# Patient Record
Sex: Female | Born: 1955 | Race: White | Hispanic: No | Marital: Married | Smoking: Former smoker
Health system: Southern US, Community
[De-identification: ages and names within clinical notes are randomized; demographics above are authoritative.]

## PROBLEM LIST (undated history)

## (undated) DIAGNOSIS — I10 Essential (primary) hypertension: Secondary | ICD-10-CM

## (undated) DIAGNOSIS — T7840XA Allergy, unspecified, initial encounter: Secondary | ICD-10-CM

## (undated) DIAGNOSIS — E785 Hyperlipidemia, unspecified: Secondary | ICD-10-CM

## (undated) DIAGNOSIS — Z9889 Other specified postprocedural states: Secondary | ICD-10-CM

## (undated) DIAGNOSIS — I2510 Atherosclerotic heart disease of native coronary artery without angina pectoris: Secondary | ICD-10-CM

## (undated) HISTORY — DX: Allergy, unspecified, initial encounter: T78.40XA

## (undated) HISTORY — PX: ENDOMETRIAL ABLATION: SHX621

## (undated) HISTORY — DX: Atherosclerotic heart disease of native coronary artery without angina pectoris: I25.10

## (undated) HISTORY — PX: TUBAL LIGATION: SHX77

## (undated) HISTORY — DX: Essential (primary) hypertension: I10

## (undated) HISTORY — DX: Hyperlipidemia, unspecified: E78.5

---

## 1997-11-28 ENCOUNTER — Other Ambulatory Visit: Admission: RE | Admit: 1997-11-28 | Discharge: 1997-11-28 | Payer: Self-pay | Admitting: Obstetrics and Gynecology

## 1998-04-18 ENCOUNTER — Other Ambulatory Visit: Admission: RE | Admit: 1998-04-18 | Discharge: 1998-04-18 | Payer: Self-pay | Admitting: Obstetrics and Gynecology

## 2000-09-29 ENCOUNTER — Encounter (INDEPENDENT_AMBULATORY_CARE_PROVIDER_SITE_OTHER): Payer: Self-pay

## 2000-09-29 ENCOUNTER — Ambulatory Visit (HOSPITAL_COMMUNITY): Admission: RE | Admit: 2000-09-29 | Discharge: 2000-09-29 | Payer: Self-pay | Admitting: Obstetrics and Gynecology

## 2001-11-22 ENCOUNTER — Emergency Department (HOSPITAL_COMMUNITY): Admission: EM | Admit: 2001-11-22 | Discharge: 2001-11-22 | Payer: Self-pay | Admitting: Emergency Medicine

## 2001-11-24 ENCOUNTER — Other Ambulatory Visit: Admission: RE | Admit: 2001-11-24 | Discharge: 2001-11-24 | Payer: Self-pay | Admitting: Obstetrics and Gynecology

## 2002-11-15 ENCOUNTER — Other Ambulatory Visit: Admission: RE | Admit: 2002-11-15 | Discharge: 2002-11-15 | Payer: Self-pay | Admitting: Specialist

## 2002-11-17 ENCOUNTER — Encounter: Payer: Self-pay | Admitting: Specialist

## 2002-11-17 ENCOUNTER — Ambulatory Visit (HOSPITAL_COMMUNITY): Admission: RE | Admit: 2002-11-17 | Discharge: 2002-11-17 | Payer: Self-pay | Admitting: Specialist

## 2003-07-20 ENCOUNTER — Ambulatory Visit (HOSPITAL_COMMUNITY): Admission: RE | Admit: 2003-07-20 | Discharge: 2003-07-20 | Payer: Self-pay | Admitting: Obstetrics & Gynecology

## 2004-05-07 ENCOUNTER — Ambulatory Visit (HOSPITAL_COMMUNITY): Admission: RE | Admit: 2004-05-07 | Discharge: 2004-05-07 | Payer: Self-pay | Admitting: Internal Medicine

## 2005-03-31 ENCOUNTER — Ambulatory Visit (HOSPITAL_COMMUNITY): Admission: RE | Admit: 2005-03-31 | Discharge: 2005-03-31 | Payer: Self-pay

## 2005-08-19 ENCOUNTER — Other Ambulatory Visit: Admission: RE | Admit: 2005-08-19 | Discharge: 2005-08-19 | Payer: Self-pay | Admitting: Obstetrics and Gynecology

## 2005-09-03 ENCOUNTER — Encounter: Payer: Self-pay | Admitting: Obstetrics & Gynecology

## 2005-09-03 ENCOUNTER — Ambulatory Visit (HOSPITAL_COMMUNITY): Admission: RE | Admit: 2005-09-03 | Discharge: 2005-09-03 | Payer: Self-pay | Admitting: Obstetrics & Gynecology

## 2005-12-22 ENCOUNTER — Ambulatory Visit: Payer: Self-pay | Admitting: Internal Medicine

## 2006-04-30 ENCOUNTER — Ambulatory Visit (HOSPITAL_COMMUNITY): Admission: RE | Admit: 2006-04-30 | Discharge: 2006-04-30 | Payer: Self-pay | Admitting: Obstetrics & Gynecology

## 2007-05-18 ENCOUNTER — Ambulatory Visit (HOSPITAL_COMMUNITY): Admission: RE | Admit: 2007-05-18 | Discharge: 2007-05-18 | Payer: Self-pay | Admitting: Obstetrics & Gynecology

## 2007-05-31 ENCOUNTER — Other Ambulatory Visit: Admission: RE | Admit: 2007-05-31 | Discharge: 2007-05-31 | Payer: Self-pay | Admitting: Obstetrics & Gynecology

## 2008-07-11 ENCOUNTER — Ambulatory Visit (HOSPITAL_COMMUNITY): Admission: RE | Admit: 2008-07-11 | Discharge: 2008-07-11 | Payer: Self-pay | Admitting: Obstetrics & Gynecology

## 2008-07-24 ENCOUNTER — Other Ambulatory Visit: Admission: RE | Admit: 2008-07-24 | Discharge: 2008-07-24 | Payer: Self-pay | Admitting: Obstetrics & Gynecology

## 2009-08-10 ENCOUNTER — Ambulatory Visit (HOSPITAL_COMMUNITY): Admission: RE | Admit: 2009-08-10 | Discharge: 2009-08-10 | Payer: Self-pay | Admitting: Obstetrics & Gynecology

## 2009-09-24 ENCOUNTER — Other Ambulatory Visit: Admission: RE | Admit: 2009-09-24 | Discharge: 2009-09-24 | Payer: Self-pay | Admitting: Obstetrics & Gynecology

## 2010-12-20 NOTE — Op Note (Signed)
Donna Mckay, Donna Mckay                  ACCOUNT NO.:  [id number]   MEDICAL RECORD NO.:  [id number]          PATIENT TYPE:  AMB   LOCATION:  DAY                           FACILITY:  [facility]   PHYSICIAN:  Lazaro Arms, M.D.   DATE OF BIRTH:  05-09-1956   DATE OF PROCEDURE:  09/03/2005  DATE OF DISCHARGE:                                 OPERATIVE REPORT   PREOPERATIVE DIAGNOSES:  1.  Menometrorrhagia.  2.  Anemia.   POSTOPERATIVE DIAGNOSES:  1.  Menometrorrhagia.  2.  Anemia.   PROCEDURE:  Hysteroscopy D&C, endometrial ablation.   SURGEON:  Dr. Despina Hidden.   ANESTHESIA:  General endotracheal.   FINDINGS:  The patient had normal endometrial cavity. There were no polyps  or submucosal myomas seen. She had an endometrial biopsy in the office which  had been normal.   DESCRIPTION OF OPERATION:  The patient was taken to the operating room and  placed in supine position where she underwent general endotracheal  anesthesia. She was placed in dorsal lithotomy position, prepped and draped  for vaginal surgery. A Grave's speculum was placed. Cervix was grasped.  Paracervical block using 0.5% Marcaine with 1:200,000 epinephrine was  placed, 20 cc total. The cervix was dilated serially to allow passage of the  hysteroscope. Endometrial cavity was found to be normal. Vigorous uterine  curettage was performed. The patient had been premedicated with Megace. A  endometrial ablation was then performed using ThermaChoice III balloon  system. It required 80 cc of D5W, required 3+ heating cycles for a total  therapy time of 21 minutes 25 seconds. It was 8-minute time of 87 to 88  degrees Celsius.  All of the fluid was recovered at the end of the  procedure, and the pressure remained stable between 155 and 165 mmHg  throughout the case. Again, the fluid was removed from the balloon. The  balloon was removed. The patient was awakened from anesthesia, taken to  recovery room in good and stable condition. She  received Ancef and Toradol  prophylactically.      Lazaro Arms, M.D.  Electronically Signed    LHE/MEDQ  D:  09/03/2005  T:  09/03/2005  Job:  [id number]

## 2010-12-20 NOTE — Op Note (Signed)
[facility]  Patient:    Donna Mckay, Donna Mckay                         MRN: [id number] Proc. Date: 09/29/00 Adm. Date:  [id number] Attending:  Amanda Cockayne                           Operative Report  PREOPERATIVE DIAGNOSIS:       Irregular bleeding and spotting on no hormones at 55 years old.  POSTOPERATIVE DIAGNOSIS:      Multiple polyps, thickened endometrium, perimenopausal.  OPERATION:  SURGEON:                      Esmeralda Arthur, M.D.  ASSISTANT:  ANESTHESIA:                   General anesthesia.  PACKS:                        None.  CATHETERS:                    None.  MEDIUM:                       Sorbitol, deficit 80 cc.  FINDINGS:                     The uterus sounded to 12 cm.  She had multiple polyps and thickened endometrium.  ESTIMATED BLOOD LOSS:  INDICATIONS:                  She had an ultrasound which showed thickened endometrium, probably polyps.  She is admitted for hysteroscopy, resection.  DESCRIPTION OF PROCEDURE:     The patient was carried to the operating room and after satisfactory general anesthesia, she was placed in the lithotomy position and prepped and draped in a sterile field.  Examination revealed the uterus to be anterior and felt enlarged.  We sounded the cervix to 12 cm.  She had no resistance to 25 Hegar dilator. We looked with the observation scope and the uterus was too big to see and you could see multiple polyps.  We then put on the resectoscope and she had very little resistance almost to 33 Hegar.  We then put in and we could see multiple polyps and I was going to resect these, but could not get a grasp.  I therefore, stopped and did the curettement and removed lots of polyps.  I then relooked and it looked like she still had a thickened lining.  With the resectoscope double blade, we then resected the endometrium and a lot of tissue was obtained.  We then coagulated and  progressed with decreased suppression.  She had no excess bleeding.  The patient tolerated the procedure well and was carried to the recovery room in good condition. DD:  09/29/00 TD:  09/30/00 Job: [id number]

## 2010-12-20 NOTE — Op Note (Signed)
NAME:  Donna Mckay, Donna Mckay                  ACCOUNT NO.:  [id number]   MEDICAL RECORD NO.:  [id number]          PATIENT TYPE:  AMB   LOCATION:  DAY                           FACILITY:  [facility]   PHYSICIAN:  R. Roetta Sessions, M.D. DATE OF BIRTH:  1956-02-04   DATE OF PROCEDURE:  05/07/2004  DATE OF DISCHARGE:                                 OPERATIVE REPORT   PROCEDURE:  Colonoscopy with ileoscopy, biopsy, and stool sampling.   INDICATION FOR PROCEDURE:  The patient is a 55 year old lady with abdominal  cramps, loose stools, poor response to antispasmodic therapy.  Colonoscopy  is now being done to further evaluate her as to the etiology of her chronic  diarrhea.  This approach has been discussed with the patient at length.  The  potential risks, benefits, and alternatives have been reviewed, questions  answered.  She is agreeable.  Please see my April 23, 2004, consultation  note for more information note.   PROCEDURE NOTE:  O2 saturation, blood pressure, pulse, and respirations were  monitored throughout the entire procedure.   CONSCIOUS SEDATION:  IV Versed 3 mg and Demerol 50 mg IV in incremental  doses.   INSTRUMENT USED:  Olympus video chip system.   FINDINGS:  Digital exam revealed no abnormalities.   ENDOSCOPIC FINDINGS:  Prep was good.   Rectum:  Examination of the rectal mucosa, including retroflexion view of  the anal verge, revealed no abnormalities.   Colon:  The colonic mucosa was surveyed from the rectosigmoid junction  through the left, transverse, and right colon to the area of the appendiceal  orifice, ileocecal valve, and cecum.  These structures were well-seen and  photographed for the record.  The terminal ileum was intubated to 10 cm.  From this level the scope was slowly withdrawn and all previously-mentioned  mucosal surfaces were again seen.  The terminal ileum appeared normal.  The  colonic mucosa appeared entirely normal except for some early diverticular  changes, left colon.  Biopsies of the sigmoid and rectum were taken for  histologic study.  Also, stool residue was suctioned out for microbiology  studies.  The patient tolerated the procedure well and was reacted in  endoscopy.   IMPRESSION:  1.  Normal rectum.  2.  Early diverticular changes, left colon, otherwise normal colon, normal      terminal ileum.  3.  Segmental biopsies and stool sample taken.   RECOMMENDATIONS:  1.  Continue Bentyl for the time being.  2.  Follow up on stool studies, biopsies.  3.  Further recommendations to follow.     [name]   RMR/MEDQ  D:  05/07/2004  T:  05/07/2004  Job:  [id number]   cc:   Kingsley Callander. Ouida Sills, M.D.  [address]  [place]  [place] [address]  Fax: [phone]

## 2010-12-20 NOTE — H&P (Signed)
NAME:  Donna Mckay                  ACCOUNT NO.:  [id number]   MEDICAL RECORD NO.:  [id number]          PATIENT TYPE:  AMB   LOCATION:  DAY                           FACILITY:  [facility]   PHYSICIAN:  Lazaro Arms, M.D.   DATE OF BIRTH:  [birth year]   DATE OF ADMISSION:  DATE OF DISCHARGE:  LH                                HISTORY & PHYSICAL   HISTORY OF PRESENT ILLNESS:  Donna Mckay is a 55 year old female who is gravida 4,  para 3.  Last menstrual period was this month with a hemoglobin of 9.5.  She  has been managed for some time for menorrhagia and anemia with birth control  pills, iron.  She is having increasing perimenopausal symptoms, and was  placed on Estrogel.  She presented earlier in the month with increasing  bleeding with a big clot.  She had a vaginal ultrasound in our office which  revealed a 14 mm endometrial stripe.  Endometrial biopsy was performed that  showed disturbed phase endometrium.  Because of the patient's long problem  with dysfunctional bleeding and menometrorrhagia, she is admitted for  hysteroscopy D&C, endometrial ablation.   PAST MEDICAL HISTORY:  1.  Hypertension, and she takes atenolol.  2.  Gastroesophageal reflux disease, and she takes AcipHex.  3.  She is on Repleva for anemia.   PAST SURGICAL HISTORY:  1.  She had tubal ligation in 1982.  2.  D&C in 1977 and D&C in 2000 because of bleeding.   PAST OBSTETRICAL HISTORY:  Three vaginal deliveries.   REVIEW OF SYSTEMS:  Otherwise negative.   PHYSICAL EXAMINATION:  VITAL SIGNS:  Weight is 198 pounds.  HEENT:  Unremarkable.  Thyroid is normal.  LUNGS:  Clear.  HEART:  Regular rate and rhythm without regurgitation or gallop.  BREASTS:  Without masses or discharge or skin changes.  ABDOMEN:  Benign without hepatosplenomegaly or masses.  PELVIC:  She has normal external genitalia.  The vagina is pink and moist  without discharge.  Cervix is parous.  Uterus is slightly enlarged,  approximately 8 weeks  size.  Adnexa negative.  EXTREMITIES:  Warm, no edema.  NEUROLOGIC:  Grossly intact.   IMPRESSION:  1.  Menometrorrhagia.  2.  Anemia.   PLAN:  The patient is admitted for hysteroscopy D&C, endometrial ablation.  She understands the risks, benefits, indications, and alternatives  and  wants to proceed.      Lazaro Arms, M.D.  Electronically Signed     LHE/MEDQ  D:  09/02/2005  T:  09/02/2005  Job:  [id number]

## 2011-09-02 ENCOUNTER — Other Ambulatory Visit: Payer: Self-pay | Admitting: Obstetrics & Gynecology

## 2011-09-02 DIAGNOSIS — Z139 Encounter for screening, unspecified: Secondary | ICD-10-CM

## 2011-09-08 ENCOUNTER — Ambulatory Visit (HOSPITAL_COMMUNITY)
Admission: RE | Admit: 2011-09-08 | Discharge: 2011-09-08 | Disposition: A | Discharge status: Home / Self Care | Payer: Self-pay | Source: Ambulatory Visit | Attending: Obstetrics & Gynecology | Admitting: Obstetrics & Gynecology

## 2011-09-08 DIAGNOSIS — Z1231 Encounter for screening mammogram for malignant neoplasm of breast: Secondary | ICD-10-CM | POA: Insufficient documentation

## 2011-09-08 DIAGNOSIS — Z139 Encounter for screening, unspecified: Secondary | ICD-10-CM

## 2011-09-23 ENCOUNTER — Other Ambulatory Visit (HOSPITAL_COMMUNITY)
Admission: RE | Admit: 2011-09-23 | Discharge: 2011-09-23 | Disposition: A | Discharge status: Home / Self Care | Payer: Self-pay | Source: Ambulatory Visit | Attending: Obstetrics & Gynecology | Admitting: Obstetrics & Gynecology

## 2011-09-23 ENCOUNTER — Other Ambulatory Visit: Payer: Self-pay | Admitting: Obstetrics & Gynecology

## 2011-09-23 DIAGNOSIS — Z01419 Encounter for gynecological examination (general) (routine) without abnormal findings: Secondary | ICD-10-CM | POA: Insufficient documentation

## 2016-01-09 ENCOUNTER — Other Ambulatory Visit: Payer: Self-pay | Admitting: Obstetrics & Gynecology

## 2016-01-09 DIAGNOSIS — Z1231 Encounter for screening mammogram for malignant neoplasm of breast: Secondary | ICD-10-CM

## 2016-01-10 ENCOUNTER — Ambulatory Visit (HOSPITAL_COMMUNITY)
Admission: RE | Admit: 2016-01-10 | Discharge: 2016-01-10 | Disposition: A | Discharge status: Home / Self Care | Payer: Self-pay | Source: Ambulatory Visit | Attending: Obstetrics & Gynecology | Admitting: Obstetrics & Gynecology

## 2016-01-10 DIAGNOSIS — Z1231 Encounter for screening mammogram for malignant neoplasm of breast: Secondary | ICD-10-CM | POA: Insufficient documentation

## 2016-01-25 ENCOUNTER — Encounter: Payer: Self-pay | Admitting: Obstetrics & Gynecology

## 2016-01-25 ENCOUNTER — Ambulatory Visit (INDEPENDENT_AMBULATORY_CARE_PROVIDER_SITE_OTHER): Payer: Self-pay | Admitting: Obstetrics & Gynecology

## 2016-01-25 ENCOUNTER — Other Ambulatory Visit (HOSPITAL_COMMUNITY)
Admission: RE | Admit: 2016-01-25 | Discharge: 2016-01-25 | Disposition: A | Discharge status: Home / Self Care | Payer: Self-pay | Source: Ambulatory Visit | Attending: Obstetrics & Gynecology | Admitting: Obstetrics & Gynecology

## 2016-01-25 VITALS — BP 162/92 | HR 76 | Ht 64.0 in | Wt 192.5 lb

## 2016-01-25 DIAGNOSIS — Z1212 Encounter for screening for malignant neoplasm of rectum: Secondary | ICD-10-CM

## 2016-01-25 DIAGNOSIS — Z01419 Encounter for gynecological examination (general) (routine) without abnormal findings: Secondary | ICD-10-CM | POA: Insufficient documentation

## 2016-01-25 DIAGNOSIS — Z1151 Encounter for screening for human papillomavirus (HPV): Secondary | ICD-10-CM | POA: Insufficient documentation

## 2016-01-25 DIAGNOSIS — Z1211 Encounter for screening for malignant neoplasm of colon: Secondary | ICD-10-CM

## 2016-01-25 NOTE — Progress Notes (Signed)
Patient ID: Donna Mckay, female   DOB: [birth year], 60 y.o.   MRN: [id number] Subjective:     Donna Mckay is a 60 y.o. female here for a routine exam.  No LMP recorded. Patient has had an ablation. No obstetric history on file. Birth Control Method:  BTL Menstrual Calendar(currently): amenorrhiec  Current complaints: none.   Current acute medical issues:  none   Recent Gynecologic History No LMP recorded. Patient has had an ablation. Last Pap: 2013,  normal Last mammogram: 2013,  normal  Past Medical History:  Diagnosis Date  . Allergy   . Hypertension     Past Surgical History:  Procedure Laterality Date  . ENDOMETRIAL ABLATION    . TUBAL LIGATION      OB History    No data available      Social History   Social History  . Marital status: Married    Spouse name: N/A  . Number of children: N/A  . Years of education: N/A   Social History Main Topics  . Smoking status: Former Smoker    Types: Cigarettes  . Smokeless tobacco: Never Used  . Alcohol use No  . Drug use: No  . Sexual activity: Yes    Birth control/ protection: Surgical   Other Topics Concern  . None   Social History Narrative  . None    Family History  Problem Relation Age of Onset  . Heart disease Mother   . Hypertension Mother   . Hypertension Father   . Diabetes Father   . Heart disease Father   . Cancer Father     prostate  . Cancer Brother   . Hypertension Daughter   . Cancer Maternal Aunt     breast     Current Outpatient Prescriptions:  .  cetirizine (ZYRTEC) 10 MG tablet, Take 10 mg by mouth daily., Disp: , Rfl:  .  Probiotic Product (PROBIOTIC DAILY) CAPS, Take 1 tablet by mouth daily., Disp: , Rfl:  .  lisinopril (PRINIVIL,ZESTRIL) 10 MG tablet, Take 10 mg by mouth daily., Disp: , Rfl: 0  Review of Systems  Review of Systems  Constitutional: Negative for fever, chills, weight loss, malaise/fatigue and diaphoresis.  HENT: Negative for hearing loss, ear pain,  nosebleeds, congestion, sore throat, neck pain, tinnitus and ear discharge.   Eyes: Negative for blurred vision, double vision, photophobia, pain, discharge and redness.  Respiratory: Negative for cough, hemoptysis, sputum production, shortness of breath, wheezing and stridor.   Cardiovascular: Negative for chest pain, palpitations, orthopnea, claudication, leg swelling and PND.  Gastrointestinal: negative for abdominal pain. Negative for heartburn, nausea, vomiting, diarrhea, constipation, blood in stool and melena.  Genitourinary: Negative for dysuria, urgency, frequency, hematuria and flank pain.  Musculoskeletal: Negative for myalgias, back pain, joint pain and falls.  Skin: Negative for itching and rash.  Neurological: Negative for dizziness, tingling, tremors, sensory change, speech change, focal weakness, seizures, loss of consciousness, weakness and headaches.  Endo/Heme/Allergies: Negative for environmental allergies and polydipsia. Does not bruise/bleed easily.  Psychiatric/Behavioral: Negative for depression, suicidal ideas, hallucinations, memory loss and substance abuse. The patient is not nervous/anxious and does not have insomnia.        Objective:  Blood pressure (!) 162/92, pulse 76, height 5\' 4"  (1.626 m), weight 192 lb 8 oz (87.3 kg).   Physical Exam  Vitals reviewed. Constitutional: She is oriented to person, place, and time. She appears well-developed and well-nourished.  HENT:  Head: Normocephalic and atraumatic.  Right Ear: External ear normal.  Left Ear: External ear normal.  Nose: Nose normal.  Mouth/Throat: Oropharynx is clear and moist.  Eyes: Conjunctivae and EOM are normal. Pupils are equal, round, and reactive to light. Right eye exhibits no discharge. Left eye exhibits no discharge. No scleral icterus.  Neck: Normal range of motion. Neck supple. No tracheal deviation present. No thyromegaly present.  Cardiovascular: Normal rate, regular rhythm, normal  heart sounds and intact distal pulses.  Exam reveals no gallop and no friction rub.   No murmur heard. Respiratory: Effort normal and breath sounds normal. No respiratory distress. She has no wheezes. She has no rales. She exhibits no tenderness.  GI: Soft. Bowel sounds are normal. She exhibits no distension and no mass. There is no tenderness. There is no rebound and no guarding.  Genitourinary:  Breasts no masses skin changes or nipple changes bilaterally      Vulva is normal without lesions Vagina is pink moist without discharge Cervix normal in appearance and pap is done Uterus is normal size shape and contour Adnexa is negative with normal sized ovaries  {Rectal    hemoccult negative, normal tone, no masses  Musculoskeletal: Normal range of motion. She exhibits no edema and no tenderness.  Neurological: She is alert and oriented to person, place, and time. She has normal reflexes. She displays normal reflexes. No cranial nerve deficit. She exhibits normal muscle tone. Coordination normal.  Skin: Skin is warm and dry. No rash noted. No erythema. No pallor.  Psychiatric: She has a normal mood and affect. Her behavior is normal. Judgment and thought content normal.       Medications Ordered at today's visit: Meds ordered this encounter  Medications  . lisinopril (PRINIVIL,ZESTRIL) 10 MG tablet    Sig: Take 10 mg by mouth daily.    Refill:  0  . cetirizine (ZYRTEC) 10 MG tablet    Sig: Take 10 mg by mouth daily.  . Probiotic Product (PROBIOTIC DAILY) CAPS    Sig: Take 1 tablet by mouth daily.    Other orders placed at today's visit: No orders of the defined types were placed in this encounter.     Assessment:    Healthy female exam.    Plan:    Mammogram ordered. Follow up in: 1 year.     No Follow-up on file.

## 2016-01-28 LAB — CYTOLOGY - PAP

## 2018-08-12 ENCOUNTER — Other Ambulatory Visit (HOSPITAL_COMMUNITY)
Admission: RE | Admit: 2018-08-12 | Discharge: 2018-08-12 | Disposition: A | Discharge status: Home / Self Care | Payer: Self-pay | Source: Ambulatory Visit | Attending: Obstetrics & Gynecology | Admitting: Obstetrics & Gynecology

## 2018-08-12 ENCOUNTER — Encounter: Payer: Self-pay | Admitting: Obstetrics & Gynecology

## 2018-08-12 ENCOUNTER — Other Ambulatory Visit: Payer: Self-pay

## 2018-08-12 ENCOUNTER — Ambulatory Visit (INDEPENDENT_AMBULATORY_CARE_PROVIDER_SITE_OTHER): Payer: Self-pay | Admitting: Obstetrics & Gynecology

## 2018-08-12 VITALS — BP 152/79 | HR 86 | Ht 64.0 in | Wt 194.0 lb

## 2018-08-12 DIAGNOSIS — Z1211 Encounter for screening for malignant neoplasm of colon: Secondary | ICD-10-CM

## 2018-08-12 DIAGNOSIS — Z1212 Encounter for screening for malignant neoplasm of rectum: Secondary | ICD-10-CM

## 2018-08-12 DIAGNOSIS — Z01419 Encounter for gynecological examination (general) (routine) without abnormal findings: Secondary | ICD-10-CM

## 2018-08-12 LAB — HEMOCCULT GUIAC POC 1CARD (OFFICE): Fecal Occult Blood, POC: NEGATIVE

## 2018-08-12 NOTE — Addendum Note (Signed)
Addended by: Tish Frederickson A on: 08/12/2018 01:26 PM   Modules accepted: Orders

## 2018-08-12 NOTE — Progress Notes (Signed)
Subjective:     Donna SleightWanda O Mckay is a 63 y.o. female here for a routine exam.  No LMP recorded. Patient has had an ablation. [id number] Birth Control Method:  BTL Menstrual Calendar(currently): post menopausal  Current complaints: none.   Current acute medical issues:  none   Recent Gynecologic History No LMP recorded. Patient has had an ablation. Last Pap: 2017,  normal Last mammogram: 2017,  normal  Past Medical History:  Diagnosis Date  . Allergy   . Hypertension     Past Surgical History:  Procedure Laterality Date  . ENDOMETRIAL ABLATION    . TUBAL LIGATION      OB History    Gravida  4   Para  3   Term      Preterm      AB  1   Living  3     SAB  1   TAB      Ectopic      Multiple      Live Births              Social History   Socioeconomic History  . Marital status: Married    Spouse name: Not on file  . Number of children: Not on file  . Years of education: Not on file  . Highest education level: Not on file  Occupational History  . Not on file  Social Needs  . Financial resource strain: Not on file  . Food insecurity:    Worry: Not on file    Inability: Not on file  . Transportation needs:    Medical: Not on file    Non-medical: Not on file  Tobacco Use  . Smoking status: Former Smoker    Types: Cigarettes  . Smokeless tobacco: Never Used  Substance and Sexual Activity  . Alcohol use: No  . Drug use: No  . Sexual activity: Yes    Birth control/protection: Surgical  Lifestyle  . Physical activity:    Days per week: Not on file    Minutes per session: Not on file  . Stress: Not on file  Relationships  . Social connections:    Talks on phone: Not on file    Gets together: Not on file    Attends religious service: Not on file    Active member of club or organization: Not on file    Attends meetings of clubs or organizations: Not on file    Relationship status: Not on file  Other Topics Concern  . Not on file  Social  History Narrative  . Not on file    Family History  Problem Relation Age of Onset  . Heart disease Mother   . Hypertension Mother   . Hypertension Father   . Diabetes Father   . Heart disease Father   . Cancer Father        prostate  . Cancer Brother   . Cancer Maternal Aunt        breast  . Hypertension Sister   . Hypertension Son   . Stroke Son      Current Outpatient Medications:  .  cetirizine (ZYRTEC) 10 MG tablet, Take 10 mg by mouth daily., Disp: , Rfl:  .  lisinopril (PRINIVIL,ZESTRIL) 20 MG tablet, Take 20 mg by mouth daily. , Disp: , Rfl: 0 .  Probiotic Product (PROBIOTIC DAILY) CAPS, Take 1 tablet by mouth daily., Disp: , Rfl:   Review of Systems  Review of Systems  Constitutional: Negative  for fever, chills, weight loss, malaise/fatigue and diaphoresis.  HENT: Negative for hearing loss, ear pain, nosebleeds, congestion, sore throat, neck pain, tinnitus and ear discharge.   Eyes: Negative for blurred vision, double vision, photophobia, pain, discharge and redness.  Respiratory: Negative for cough, hemoptysis, sputum production, shortness of breath, wheezing and stridor.   Cardiovascular: Negative for chest pain, palpitations, orthopnea, claudication, leg swelling and PND.  Gastrointestinal: negative for abdominal pain. Negative for heartburn, nausea, vomiting, diarrhea, constipation, blood in stool and melena.  Genitourinary: Negative for dysuria, urgency, frequency, hematuria and flank pain.  Musculoskeletal: Negative for myalgias, back pain, joint pain and falls.  Skin: Negative for itching and rash.  Neurological: Negative for dizziness, tingling, tremors, sensory change, speech change, focal weakness, seizures, loss of consciousness, weakness and headaches.  Endo/Heme/Allergies: Negative for environmental allergies and polydipsia. Does not bruise/bleed easily.  Psychiatric/Behavioral: Negative for depression, suicidal ideas, hallucinations, memory loss and  substance abuse. The patient is not nervous/anxious and does not have insomnia.        Objective:  Blood pressure (!) 152/79, pulse 86, height 5\' 4"  (1.626 m), weight 194 lb (88 kg).   Physical Exam  Vitals reviewed. Constitutional: She is oriented to person, place, and time. She appears well-developed and well-nourished.  HENT:  Head: Normocephalic and atraumatic.        Right Ear: External ear normal.  Left Ear: External ear normal.  Nose: Nose normal.  Mouth/Throat: Oropharynx is clear and moist.  Eyes: Conjunctivae and EOM are normal. Pupils are equal, round, and reactive to light. Right eye exhibits no discharge. Left eye exhibits no discharge. No scleral icterus.  Neck: Normal range of motion. Neck supple. No tracheal deviation present. No thyromegaly present.  Cardiovascular: Normal rate, regular rhythm, normal heart sounds and intact distal pulses.  Exam reveals no gallop and no friction rub.   No murmur heard. Respiratory: Effort normal and breath sounds normal. No respiratory distress. She has no wheezes. She has no rales. She exhibits no tenderness.  GI: Soft. Bowel sounds are normal. She exhibits no distension and no mass. There is no tenderness. There is no rebound and no guarding.  Genitourinary:  Breasts no masses skin changes or nipple changes bilaterally      Vulva is normal without lesions Vagina is pink moist without discharge Cervix normal in appearance and pap is done Uterus is normal size shape and contour Adnexa is negative with normal sized ovaries  {Rectal    hemoccult negative, normal tone, no masses  Musculoskeletal: Normal range of motion. She exhibits no edema and no tenderness.  Neurological: She is alert and oriented to person, place, and time. She has normal reflexes. She displays normal reflexes. No cranial nerve deficit. She exhibits normal muscle tone. Coordination normal.  Skin: Skin is warm and dry. No rash noted. No erythema. No pallor.   Psychiatric: She has a normal mood and affect. Her behavior is normal. Judgment and thought content normal.       Medications Ordered at today's visit: No orders of the defined types were placed in this encounter.   Other orders placed at today's visit: No orders of the defined types were placed in this encounter.     Assessment:    Healthy female exam.    Plan:    Mammogram ordered. Follow up in: 2 years.     Return in about 2 years (around 08/12/2020) for yearly.

## 2018-08-13 LAB — CYTOLOGY - PAP
Diagnosis: NEGATIVE
HPV: NOT DETECTED

## 2019-02-11 ENCOUNTER — Other Ambulatory Visit: Payer: Self-pay

## 2019-02-11 DIAGNOSIS — Z20822 Contact with and (suspected) exposure to covid-19: Secondary | ICD-10-CM

## 2019-02-17 LAB — NOVEL CORONAVIRUS, NAA: SARS-CoV-2, NAA: NOT DETECTED

## 2019-05-12 ENCOUNTER — Other Ambulatory Visit (HOSPITAL_COMMUNITY): Payer: Self-pay | Admitting: Obstetrics & Gynecology

## 2019-05-12 DIAGNOSIS — Z1231 Encounter for screening mammogram for malignant neoplasm of breast: Secondary | ICD-10-CM

## 2019-05-27 ENCOUNTER — Ambulatory Visit (HOSPITAL_COMMUNITY)
Admission: RE | Admit: 2019-05-27 | Discharge: 2019-05-27 | Disposition: A | Discharge status: Home / Self Care | Payer: Self-pay | Source: Ambulatory Visit | Attending: Obstetrics & Gynecology | Admitting: Obstetrics & Gynecology

## 2019-05-27 ENCOUNTER — Other Ambulatory Visit: Payer: Self-pay

## 2019-05-27 DIAGNOSIS — Z1231 Encounter for screening mammogram for malignant neoplasm of breast: Secondary | ICD-10-CM | POA: Insufficient documentation

## 2020-01-04 ENCOUNTER — Telehealth: Payer: Self-pay | Admitting: Orthopaedic Surgery

## 2020-01-04 NOTE — Telephone Encounter (Signed)
Patient called; relays saw Dr Hilda Lias some years ago; states just stepped up onto running board of truck and heard a pop in right knee. Said this knee had been bothering her and that she has an appointment scheduled Friday with her primary care, Dr Ouida Sills. Relayed that we unfortunately have no openings today, and have Dr Romeo Apple here for just a short while this morning.  Discussed seeking treatment at urgent care or Emergency room; states aware of the [facility] urgent care in [place] - will go there, and will call us back regarding appointment.

## 2020-07-05 ENCOUNTER — Other Ambulatory Visit (HOSPITAL_COMMUNITY): Payer: Self-pay | Admitting: Obstetrics & Gynecology

## 2020-07-05 DIAGNOSIS — Z1231 Encounter for screening mammogram for malignant neoplasm of breast: Secondary | ICD-10-CM

## 2020-07-12 ENCOUNTER — Other Ambulatory Visit: Payer: Self-pay

## 2020-07-12 ENCOUNTER — Ambulatory Visit (HOSPITAL_COMMUNITY)
Admission: RE | Admit: 2020-07-12 | Discharge: 2020-07-12 | Disposition: A | Discharge status: Home / Self Care | Payer: Self-pay | Source: Ambulatory Visit | Attending: Obstetrics & Gynecology | Admitting: Obstetrics & Gynecology

## 2020-07-12 ENCOUNTER — Ambulatory Visit: Payer: Self-pay

## 2020-07-12 ENCOUNTER — Encounter: Payer: Self-pay | Admitting: Orthopaedic Surgery

## 2020-07-12 ENCOUNTER — Ambulatory Visit (INDEPENDENT_AMBULATORY_CARE_PROVIDER_SITE_OTHER): Payer: Self-pay | Admitting: Orthopaedic Surgery

## 2020-07-12 VITALS — BP 190/88 | HR 94 | Ht 63.5 in | Wt 199.0 lb

## 2020-07-12 DIAGNOSIS — M25562 Pain in left knee: Secondary | ICD-10-CM

## 2020-07-12 DIAGNOSIS — Z1231 Encounter for screening mammogram for malignant neoplasm of breast: Secondary | ICD-10-CM | POA: Insufficient documentation

## 2020-07-12 NOTE — Progress Notes (Signed)
Subjective:    Patient ID: Donna Mckay, female    DOB: 1956-07-18, 64 y.o.   MRN: [id number]  HPI She has been having a little knee pain on the left that got worse last week when she stepped up into a Santa sled to take a picture.  She felt a pop then had marked medial knee pain.  She sat down.  She limped out of the sled and went home and put ice on the knee.  She has been taking ibuprofen 800 bid.  It helps but her knee hurts.  She works at [employer] with her husband who is Education officer, environmental.  A member gave her a brace to use. She has medial knee pain, swelling, giving way.  It is not getting any better.  At times she has little pain and other times it really hurts and she cannot bear weight.  The giving way bothers her and she uses a support.  She has no other joint pain, no trauma,no redness.    Review of Systems  Constitutional: Positive for activity change.  Musculoskeletal: Positive for arthralgias, gait problem and joint swelling.  All other systems reviewed and are negative.  For Review of Systems, all other systems reviewed and are negative.  The following is a summary of the past history medically, past history surgically, known current medicines, social history and family history.  This information is gathered electronically by the computer from prior information and documentation.  I review this each visit and have found including this information at this point in the chart is beneficial and informative.   Past Medical History:  Diagnosis Date  . Allergy   . Hypertension     Past Surgical History:  Procedure Laterality Date  . ENDOMETRIAL ABLATION    . TUBAL LIGATION      Current Outpatient Medications on File Prior to Visit  Medication Sig Dispense Refill  . amLODipine (NORVASC) 5 MG tablet Take 5 mg by mouth daily.    . cetirizine (ZYRTEC) 10 MG tablet Take 10 mg by mouth daily.    Marland Kitchen lisinopril (ZESTRIL) 40 MG tablet Take 40 mg by mouth daily.    . Probiotic Product  (PROBIOTIC DAILY) CAPS Take 1 tablet by mouth daily.     No current facility-administered medications on file prior to visit.    Social History   Socioeconomic History  . Marital status: Married    Spouse name: Not on file  . Number of children: Not on file  . Years of education: Not on file  . Highest education level: Not on file  Occupational History  . Not on file  Tobacco Use  . Smoking status: Former Smoker    Types: Cigarettes  . Smokeless tobacco: Never Used  Vaping Use  . Vaping Use: Never used  Substance and Sexual Activity  . Alcohol use: No  . Drug use: No  . Sexual activity: Yes    Birth control/protection: Surgical  Other Topics Concern  . Not on file  Social History Narrative  . Not on file   Social Determinants of Health   Financial Resource Strain: Not on file  Food Insecurity: Not on file  Transportation Needs: Not on file  Physical Activity: Not on file  Stress: Not on file  Social Connections: Not on file  Intimate Partner Violence: Not on file    Family History  Problem Relation Age of Onset  . Heart disease Mother   . Hypertension Mother   .  Hypertension Father   . Diabetes Father   . Heart disease Father   . Cancer Father        prostate  . Cancer Brother   . Cancer Maternal Aunt        breast  . Hypertension Sister   . Hypertension Son   . Stroke Son     BP (!) 190/88   Pulse 94   Ht 5' 3.5" (1.613 m)   Wt 199 lb (90.3 kg)   BMI 34.70 kg/m   Body mass index is 34.7 kg/m.      Objective:   Physical Exam Vitals and nursing note reviewed. Exam conducted with a chaperone present.  Constitutional:      Appearance: She is well-developed and well-nourished.  HENT:     Head: Normocephalic and atraumatic.  Eyes:     Extraocular Movements: EOM normal.     Conjunctiva/sclera: Conjunctivae normal.     Pupils: Pupils are equal, round, and reactive to light.  Cardiovascular:     Rate and Rhythm: Normal rate and regular  rhythm.     Pulses: Intact distal pulses.  Pulmonary:     Effort: Pulmonary effort is normal.  Abdominal:     Palpations: Abdomen is soft.  Musculoskeletal:     Cervical back: Normal range of motion and neck supple.       Legs:  Skin:    General: Skin is warm and dry.  Neurological:     Mental Status: She is alert and oriented to person, place, and time.     Cranial Nerves: No cranial nerve deficit.     Motor: No abnormal muscle tone.     Coordination: Coordination normal.     Deep Tendon Reflexes: Reflexes are normal and symmetric. Reflexes normal.  Psychiatric:        Mood and Affect: Mood and affect normal.        Behavior: Behavior normal.        Thought Content: Thought content normal.        Judgment: Judgment normal.    X-rays were done of the left knee, reported separately.       Assessment & Plan:   Encounter Diagnosis  Name Primary?  . Acute pain of left knee Yes   I am concerned about a medial meniscus tear.  I will get MRI.  PROCEDURE NOTE:  The patient requests injections of the left knee , verbal consent was obtained.  The left knee was prepped appropriately after time out was performed.   Sterile technique was observed and injection of 1 cc of Depo-Medrol 40 mg with several cc's of plain xylocaine. Anesthesia was provided by ethyl chloride and a 20-gauge needle was used to inject the knee area. The injection was tolerated well.  A band aid dressing was applied.  The patient was advised to apply ice later today and tomorrow to the injection sight as needed.  I will give knee brace.  Return in two weeks.  I will be out of town and I will have her see Dr. Romeo Apple or Dr. Dallas Schimke in my absence.  Electronically Signed Darreld Mclean, MD 12/9/20218:55 AM

## 2020-07-24 ENCOUNTER — Other Ambulatory Visit: Payer: Self-pay

## 2020-07-24 ENCOUNTER — Ambulatory Visit (HOSPITAL_COMMUNITY)
Admission: RE | Admit: 2020-07-24 | Discharge: 2020-07-24 | Disposition: A | Discharge status: Home / Self Care | Payer: Self-pay | Source: Ambulatory Visit | Attending: Orthopaedic Surgery | Admitting: Orthopaedic Surgery

## 2020-07-24 ENCOUNTER — Telehealth: Payer: Self-pay | Admitting: Orthopedic Surgery

## 2020-07-24 DIAGNOSIS — M25562 Pain in left knee: Secondary | ICD-10-CM | POA: Insufficient documentation

## 2020-07-24 NOTE — Telephone Encounter (Signed)
Please call the patient and have her come in to see Dr. Hilda Lias for the visit. Dr. Dallas Schimke does not recommend a virtual visit for this patient. He prefers she come in person so he recommends Dr. Hilda Lias. Thanks

## 2020-07-24 NOTE — Telephone Encounter (Signed)
Patient called to relay that she had her MRI done today as scheduled. States she is unable to be here for her follow up appointment on 07/31/20, due to going out of town next week; asking if she may have a virtual visit on that, or another day, as advised?

## 2020-07-24 NOTE — Telephone Encounter (Signed)
Called back to patient to relay; left message to call back.

## 2020-07-25 ENCOUNTER — Telehealth: Payer: Self-pay | Admitting: Orthopaedic Surgery

## 2020-07-25 NOTE — Telephone Encounter (Signed)
Patient had called to request to reschedule her MRI follow up appointment for results due to being out of town; also 'has laryngitis'; therefore, has rescheduled to after 1st of year when Dr Hilda Lias returns* *Dr Hilda Lias, due to patient's self-pay billing status, may patient receive a phone call/virtual visit for results? Aware there is a fee for virtual as well.

## 2020-07-25 NOTE — Telephone Encounter (Signed)
Patient returned call; aware of updated appointment.

## 2020-07-26 NOTE — Telephone Encounter (Signed)
She can log in to My Chart and read her results and that costs nothing.  You would know about virtual visit costs more than me or ask Toniann Fail.

## 2020-07-31 ENCOUNTER — Ambulatory Visit: Payer: Self-pay | Admitting: Orthopaedic Surgery

## 2020-08-01 NOTE — Telephone Encounter (Signed)
Routing to nurse to advise

## 2020-08-02 NOTE — Telephone Encounter (Signed)
I'm not sure what the virtual cost would be but she could benefit from an appointment with a surgeon. See if she can be seen by Dr. Dallas Schimke.

## 2020-08-02 NOTE — Telephone Encounter (Signed)
Called patient to offer appointment; left voice message to return call. °

## 2020-08-02 NOTE — Telephone Encounter (Signed)
Patient returned call. Appointment scheduled. Requests to see Dr Romeo Apple regarding MRI and further treatment results ?discuss surgery.

## 2020-08-07 ENCOUNTER — Ambulatory Visit: Payer: Self-pay | Admitting: Orthopedic Surgery

## 2020-08-15 ENCOUNTER — Other Ambulatory Visit: Payer: Self-pay

## 2020-08-15 ENCOUNTER — Ambulatory Visit (INDEPENDENT_AMBULATORY_CARE_PROVIDER_SITE_OTHER): Payer: Self-pay | Admitting: Orthopedic Surgery

## 2020-08-15 ENCOUNTER — Encounter: Payer: Self-pay | Admitting: Orthopedic Surgery

## 2020-08-15 VITALS — BP 148/82 | HR 87 | Ht 63.5 in | Wt 198.4 lb

## 2020-08-15 DIAGNOSIS — M23322 Other meniscus derangements, posterior horn of medial meniscus, left knee: Secondary | ICD-10-CM

## 2020-08-15 DIAGNOSIS — M1710 Unilateral primary osteoarthritis, unspecified knee: Secondary | ICD-10-CM

## 2020-08-15 DIAGNOSIS — M1712 Unilateral primary osteoarthritis, left knee: Secondary | ICD-10-CM

## 2020-08-15 NOTE — Patient Instructions (Addendum)
Meniscus Tear Surgery A meniscus is a wedge-shaped piece of cartilage that acts as cushioning inside the knee. Surgery aims to remove or repair a torn meniscus, often a result of a sports injury or degeneration from aging. Tell a health care provider about:  Any allergies you have.  All medicines you are taking, including vitamins, herbs, eye drops, creams, and over-the-counter medicines.  Any problems you or family members have had with anesthetic medicines.  Any blood disorders you have.  Any surgeries you have had.  Any medical conditions you have.  Whether you are pregnant or may be pregnant. What are the risks? Generally, this is a safe procedure. However, problems may occur, including:  Bleeding.  Infection.  Allergic reactions to medicines, including anesthetics.  Damage to nerves, blood vessels, or other structures of the knee.  Pain that does not go away or stiffness after surgery.  A blood clot that forms in a leg and travels to the lungs (pulmonary embolism). What happens before the procedure? Staying hydrated Follow instructions from your health care provider about hydration, which may include:  Up to 2 hours before the procedure - you may continue to drink clear liquids, such as water, clear fruit juice, black coffee, and plain tea. Eating and drinking restrictions Follow instructions from your health care provider about eating and drinking, which may include:  8 hours before the procedure - stop eating heavy meals or foods, such as meat, fried foods, or fatty foods.  6 hours before the procedure - stop eating light meals or foods, such as toast or cereal.  6 hours before the procedure - stop drinking milk or drinks that contain milk.  2 hours before the procedure - stop drinking clear liquids. Medicines Ask your health care provider about:  Changing or stopping your regular medicines. This is especially important if you are taking diabetes medicines or  blood thinners.  Taking medicines such as aspirin and ibuprofen. These medicines can thin your blood. Do not take these medicines unless your health care provider tells you to take them.  Taking over-the-counter medicines, vitamins, herbs, and supplements. General instructions  Do not use any products that contain nicotine or tobacco for at least 4 weeks before the procedure. These products include cigarettes, e-cigarettes, and chewing tobacco. If you need help quitting, ask your health care provider.  Plan to have a responsible adult take you home from the hospital or clinic.  If you will be going home right after the procedure, plan to have a responsible adult care for you for the time you are told. This is important.  Ask your health care provider: ? How your surgery site will be marked. ? What steps will be taken to help prevent infection. These may include:  Removing hair at the surgery site.  Washing skin with a germ-killing soap.  Taking antibiotic medicine. What happens during the procedure?  An IV will be inserted into one of your veins.  You will be given one or more of the following: ? Medicine to help you relax (sedative). ? Medicine to make you fall asleep (general anesthetic). ? Medicine injected into an area of your body to numb everything below and slightly above the injection site (regional anesthetic).  A cuff may be placed around your upper leg to slow blood flow to your lower leg during the procedure.  Your surgeon will make small incisions around your knee. A clear, germ-free fluid will be injected into your knee through one incision to help  your surgeon see inside the joint.  A thin, flexible, tube-like instrument with a camera on the end (arthroscope) will be inserted through another incision.  The camera will send images to a screen in the operating room. These images will guide your surgeon during surgery.  Surgical instruments will be inserted through  the other incisions.  Your surgeon will remove torn meniscus pieces, or the edges of the tear will be repaired with stitches (sutures).  The instruments and the scope will be removed.  Your knee joint will be flushed out with fluid, and your incisions will be closed using small sutures or adhesive strips.  A bandage (dressing) will be placed over your knee, and another dressing will be wrapped around your knee. The procedure may vary among health care providers and hospitals.   What happens after the procedure?  Your blood pressure, heart rate, breathing rate, and blood oxygen level will be monitored until you leave the hospital or clinic.  You will be given medicine to control pain.  You may be fitted with a knee brace and given crutches or a walker.  You may be shown how to do physical therapy exercises to help you recover. Summary  A meniscus is a wedge-shaped piece of cartilage that acts as cushioning inside the knee. Surgery involves removing or repairing a torn meniscus.  Follow instructions from your health care provider about eating and drinking and changing or stopping your regular medicines before the procedure. This is especially important if you are taking diabetes medicines or blood thinners.  An arthroscope and other surgical instruments will be inserted through incisions. Your surgeon will remove torn meniscus pieces, or the edges of the tear will be repaired with stitches (sutures).  After the procedure, a bandage (dressing) will be placed over your knee. Another dressing will be wrapped around your knee.  You will be given medicine to control pain. You may be fitted with a knee brace and given crutches or a walker. This information is not intended to replace advice given to you by your health care provider. Make sure you discuss any questions you have with your health care provider. Document Revised: 12/19/2019 Document Reviewed: 12/19/2019 Elsevier Patient Education   2021 [employer]  Meniscus Tear Surgery A meniscus is a wedge-shaped piece of cartilage that acts as cushioning inside the knee. Surgery aims to remove or repair a torn meniscus, often a result of a sports injury or degeneration from aging. Tell a health care provider about:  Any allergies you have.  All medicines you are taking, including vitamins, herbs, eye drops, creams, and over-the-counter medicines.  Any problems you or family members have had with anesthetic medicines.  Any blood disorders you have.  Any surgeries you have had.  Any medical conditions you have.  Whether you are pregnant or may be pregnant. What are the risks? Generally, this is a safe procedure. However, problems may occur, including:  Bleeding.  Infection.  Allergic reactions to medicines, including anesthetics.  Damage to nerves, blood vessels, or other structures of the knee.  Pain that does not go away or stiffness after surgery.  A blood clot that forms in a leg and travels to the lungs (pulmonary embolism). What happens before the procedure? Staying hydrated Follow instructions from your health care provider about hydration, which may include:  Up to 2 hours before the procedure - you may continue to drink clear liquids, such as water, clear fruit juice, black coffee, and plain tea. Eating and  drinking restrictions Follow instructions from your health care provider about eating and drinking, which may include:  8 hours before the procedure - stop eating heavy meals or foods, such as meat, fried foods, or fatty foods.  6 hours before the procedure - stop eating light meals or foods, such as toast or cereal.  6 hours before the procedure - stop drinking milk or drinks that contain milk.  2 hours before the procedure - stop drinking clear liquids. Medicines Ask your health care provider about:  Changing or stopping your regular medicines. This is especially important if you are taking  diabetes medicines or blood thinners.  Taking medicines such as aspirin and ibuprofen. These medicines can thin your blood. Do not take these medicines unless your health care provider tells you to take them.  Taking over-the-counter medicines, vitamins, herbs, and supplements. General instructions  Do not use any products that contain nicotine or tobacco for at least 4 weeks before the procedure. These products include cigarettes, e-cigarettes, and chewing tobacco. If you need help quitting, ask your health care provider.  Plan to have a responsible adult take you home from the hospital or clinic.  If you will be going home right after the procedure, plan to have a responsible adult care for you for the time you are told. This is important.  Ask your health care provider: ? How your surgery site will be marked. ? What steps will be taken to help prevent infection. These may include:  Removing hair at the surgery site.  Washing skin with a germ-killing soap.  Taking antibiotic medicine. What happens during the procedure?  An IV will be inserted into one of your veins.  You will be given one or more of the following: ? Medicine to help you relax (sedative). ? Medicine to make you fall asleep (general anesthetic). ? Medicine injected into an area of your body to numb everything below and slightly above the injection site (regional anesthetic).  A cuff may be placed around your upper leg to slow blood flow to your lower leg during the procedure.  Your surgeon will make small incisions around your knee. A clear, germ-free fluid will be injected into your knee through one incision to help your surgeon see inside the joint.  A thin, flexible, tube-like instrument with a camera on the end (arthroscope) will be inserted through another incision.  The camera will send images to a screen in the operating room. These images will guide your surgeon during surgery.  Surgical instruments  will be inserted through the other incisions.  Your surgeon will remove torn meniscus pieces, or the edges of the tear will be repaired with stitches (sutures).  The instruments and the scope will be removed.  Your knee joint will be flushed out with fluid, and your incisions will be closed using small sutures or adhesive strips.  A bandage (dressing) will be placed over your knee, and another dressing will be wrapped around your knee. The procedure may vary among health care providers and hospitals.   What happens after the procedure?  Your blood pressure, heart rate, breathing rate, and blood oxygen level will be monitored until you leave the hospital or clinic.  You will be given medicine to control pain.  You may be fitted with a knee brace and given crutches or a walker.  You may be shown how to do physical therapy exercises to help you recover. Summary  A meniscus is a wedge-shaped piece of cartilage that  acts as cushioning inside the knee. Surgery involves removing or repairing a torn meniscus.  Follow instructions from your health care provider about eating and drinking and changing or stopping your regular medicines before the procedure. This is especially important if you are taking diabetes medicines or blood thinners.  An arthroscope and other surgical instruments will be inserted through incisions. Your surgeon will remove torn meniscus pieces, or the edges of the tear will be repaired with stitches (sutures).  After the procedure, a bandage (dressing) will be placed over your knee. Another dressing will be wrapped around your knee.  You will be given medicine to control pain. You may be fitted with a knee brace and given crutches or a walker. This information is not intended to replace advice given to you by your health care provider. Make sure you discuss any questions you have with your health care provider. Document Revised: 12/19/2019 Document Reviewed:  12/19/2019 Elsevier Patient Education  2021 [employer].

## 2020-08-15 NOTE — Progress Notes (Signed)
Donna Mckay  08/15/2020  Body mass index is 34.59 kg/m.  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  The patient is going to wait to talk it over with her husband considering waiting till June versus immediate surgery.  In the meantime she will continue Advil knee brace and activity modification   Encounter Diagnoses  Name Primary?  . Primary localized osteoarthritis of knee Yes  . Derangement of posterior horn of medial meniscus of left knee     Imaging Plain films show normal alignment to the knee with a small effusion.  There are no obvious arthritic changes or secondary bone changes seen on films taken on July 12, 2020  She also had an MRI that showed a torn medial meniscus and patellofemoral arthritis and medial compartment arthritis   HISTORY SECTION :  Chief Complaint  Patient presents with  . Knee Pain    L/ always wearing the brace it is good. Going up steps bothers me.   HPI The patient presents for evaluation left knee.  The patient injured her knee during the Christmas tree lighting.  She stepped up on 2 steps and twisted felt a pop in her knee and has had pain ever since.  She did see Dr. Hilda Lias and she received an injection and a brace and she notes that with the brace the injection and Advil and activity modification her pain is improved the only problem she has is climbing the steps and if she twists on the left knee  She is considering surgery after MRI showed torn medial meniscus   Review of Systems  Respiratory: Negative for shortness of breath.   Cardiovascular: Negative for chest pain.  All other systems reviewed and are negative.    has a past medical history of Allergy and Hypertension.   Past Surgical History:  Procedure Laterality Date  . ENDOMETRIAL ABLATION    . TUBAL LIGATION      Social History   Tobacco Use  . Smoking status: Former Smoker     Types: Cigarettes  . Smokeless tobacco: Never Used  Vaping Use  . Vaping Use: Never used  Substance Use Topics  . Alcohol use: No  . Drug use: No    Family History  Problem Relation Age of Onset  . Heart disease Mother   . Hypertension Mother   . Hypertension Father   . Diabetes Father   . Heart disease Father   . Cancer Father        prostate  . Cancer Brother   . Cancer Maternal Aunt        breast  . Hypertension Sister   . Hypertension Son   . Stroke Son       Allergies  Allergen Reactions  . Mucinex [Guaifenesin Er] Hives     Current Outpatient Medications:  .  amLODipine (NORVASC) 5 MG tablet, Take 5 mg by mouth daily., Disp: , Rfl:  .  cetirizine (ZYRTEC) 10 MG tablet, Take 10 mg by mouth daily., Disp: , Rfl:  .  lisinopril (ZESTRIL) 40 MG tablet, Take 40 mg by mouth daily., Disp: , Rfl:  .  Probiotic Product (PROBIOTIC DAILY) CAPS, Take 1 tablet by mouth daily., Disp: , Rfl:    PHYSICAL EXAM SECTION: BP (!) 148/82   Pulse 87   Ht 5' 3.5" (1.613 m)   Wt 198 lb 6 oz (90 kg)  BMI 34.59 kg/m   Body mass index is 34.59 kg/m.   General appearance: Well-developed well-nourished no gross deformities  Eyes clear normal vision no evidence of conjunctivitis or jaundice, extraocular muscles intact  ENT: ears hearing normal, nasal passages clear, throat clear   Lymph nodes: No lymphadenopathy  Neck is supple without palpable mass, full range of motion  Cardiovascular normal pulse and perfusion in all 4 extremities normal color without edema  Neurologically deep tendon reflexes are equal and normal, no sensation loss or deficits no pathologic reflexes  Psychological: Awake alert and oriented x3 mood and affect normal  Skin no lacerations or ulcerations no nodularity no palpable masses, no erythema or nodularity  Musculoskeletal:  Left knee Skin normal  Tender medial joint line Positive [place] No ligament injury detected Normal muscle  tone   9:23 AM

## 2020-09-13 IMAGING — MG DIGITAL SCREENING BILAT W/ TOMO W/ CAD
6 of 10 series · 6 of 30 positions shown · non-contrast
Comparison: Previous exam(s).

CLINICAL DATA: Screening.

EXAM:
DIGITAL SCREENING BILATERAL MAMMOGRAM WITH TOMO AND CAD

[R CC synth-2D]
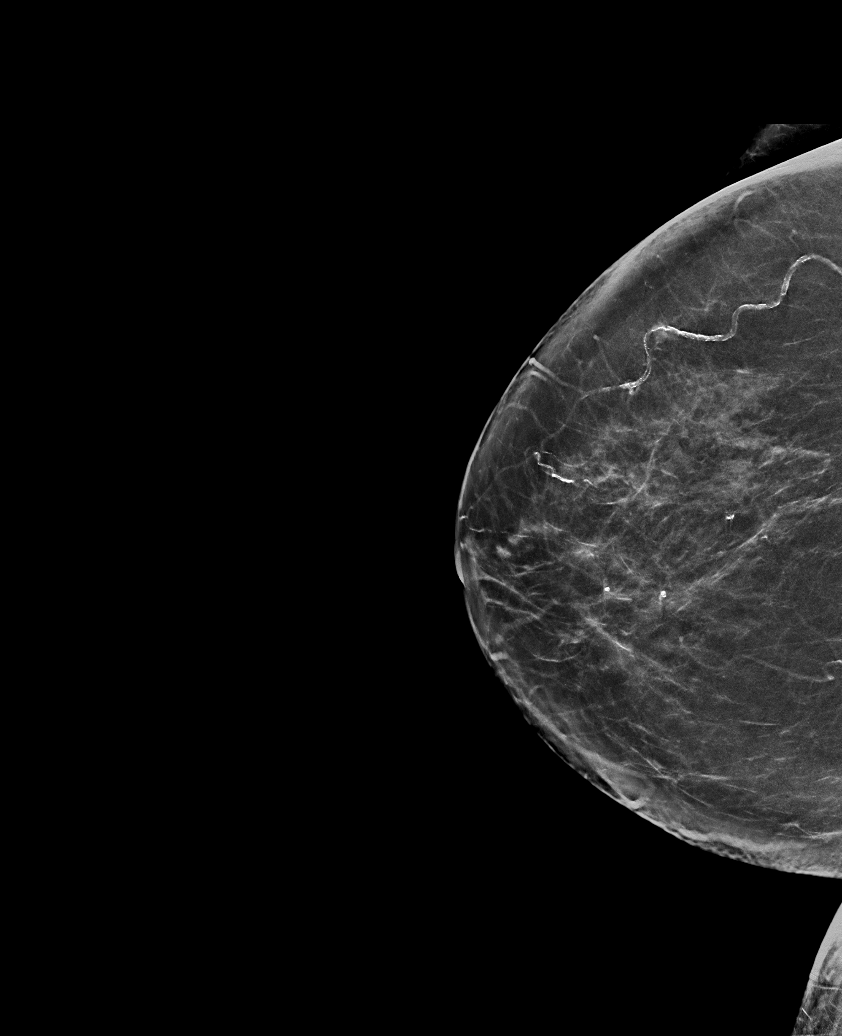

[L MLO synth-2D (1 of 2)]
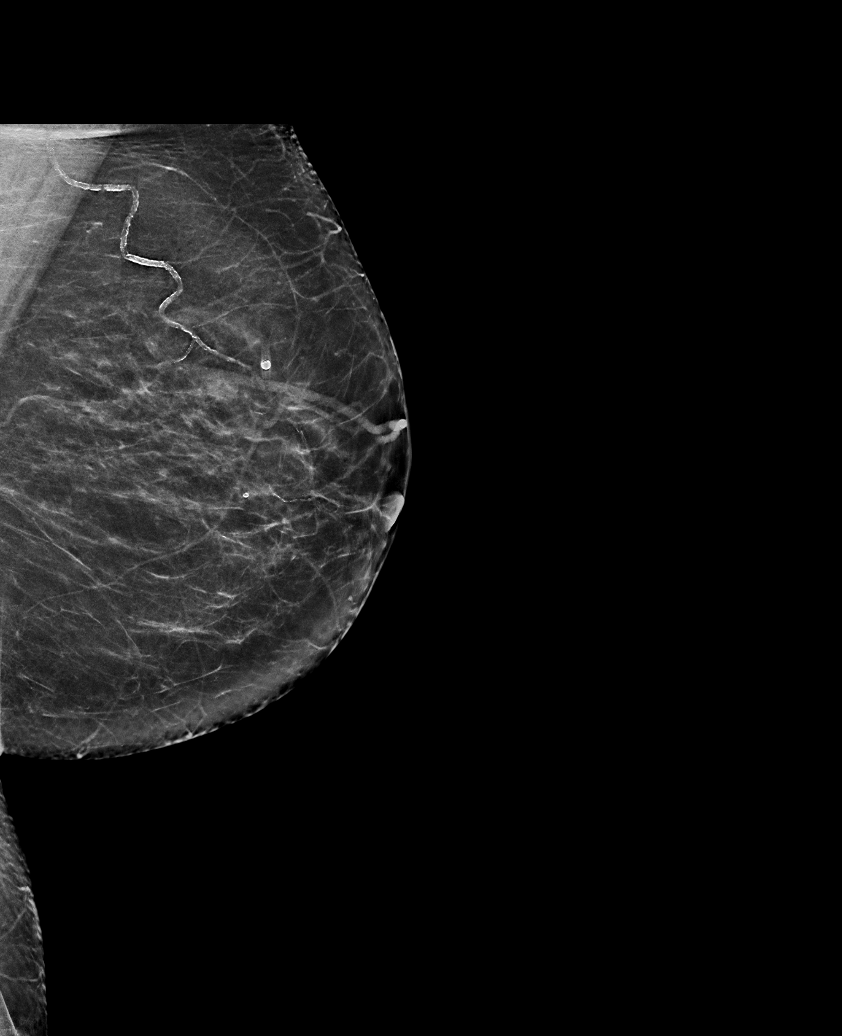

[L CC synth-2D]
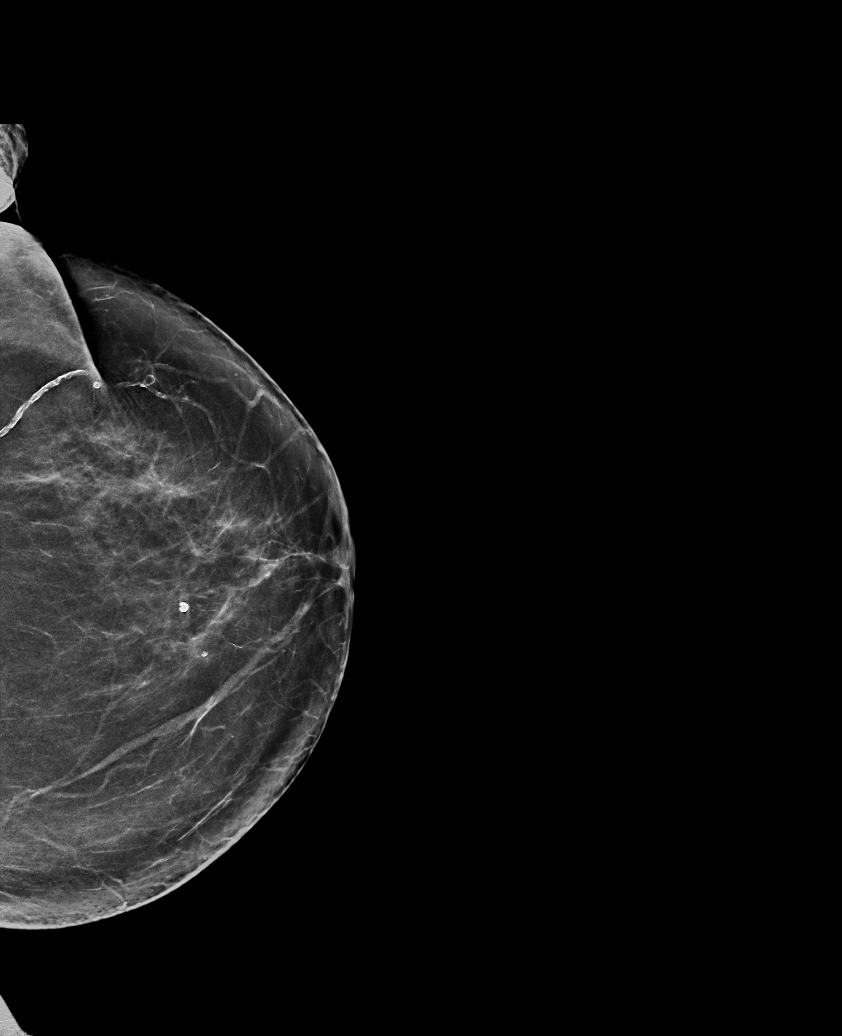

[L MLO synth-2D (2 of 2)]
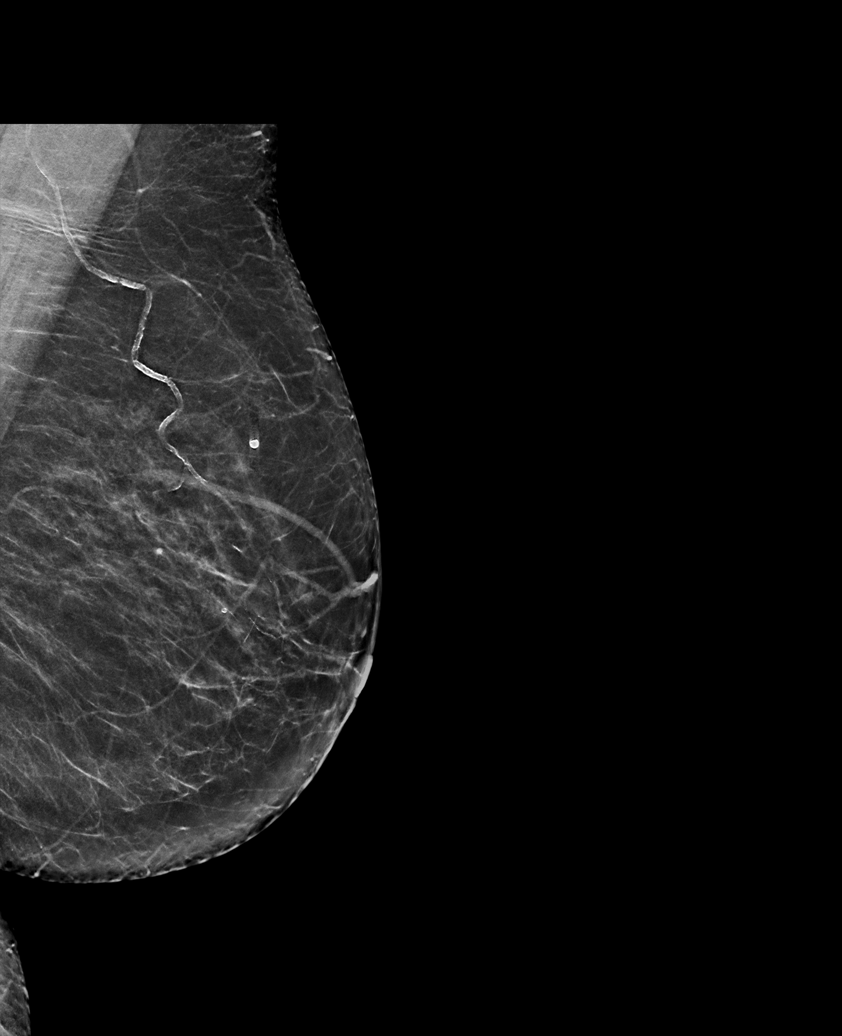

[R MLO synth-2D]
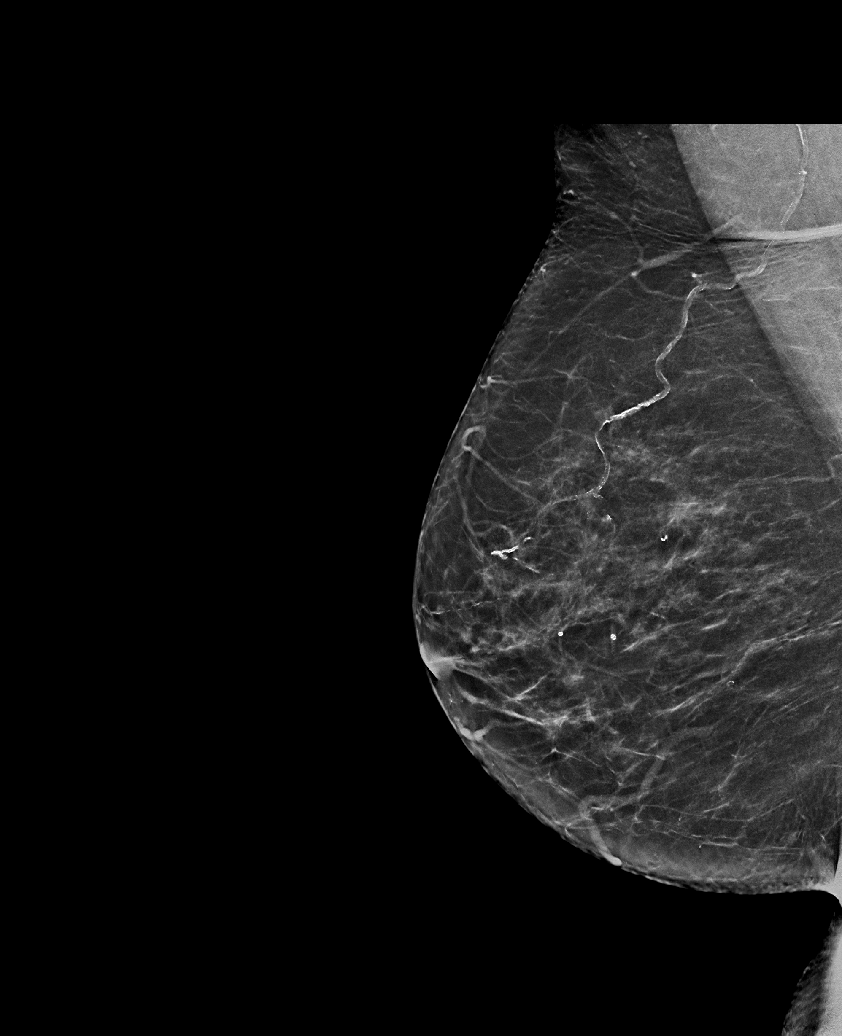

[L MLO tomo · tomo slice 35/70.0]
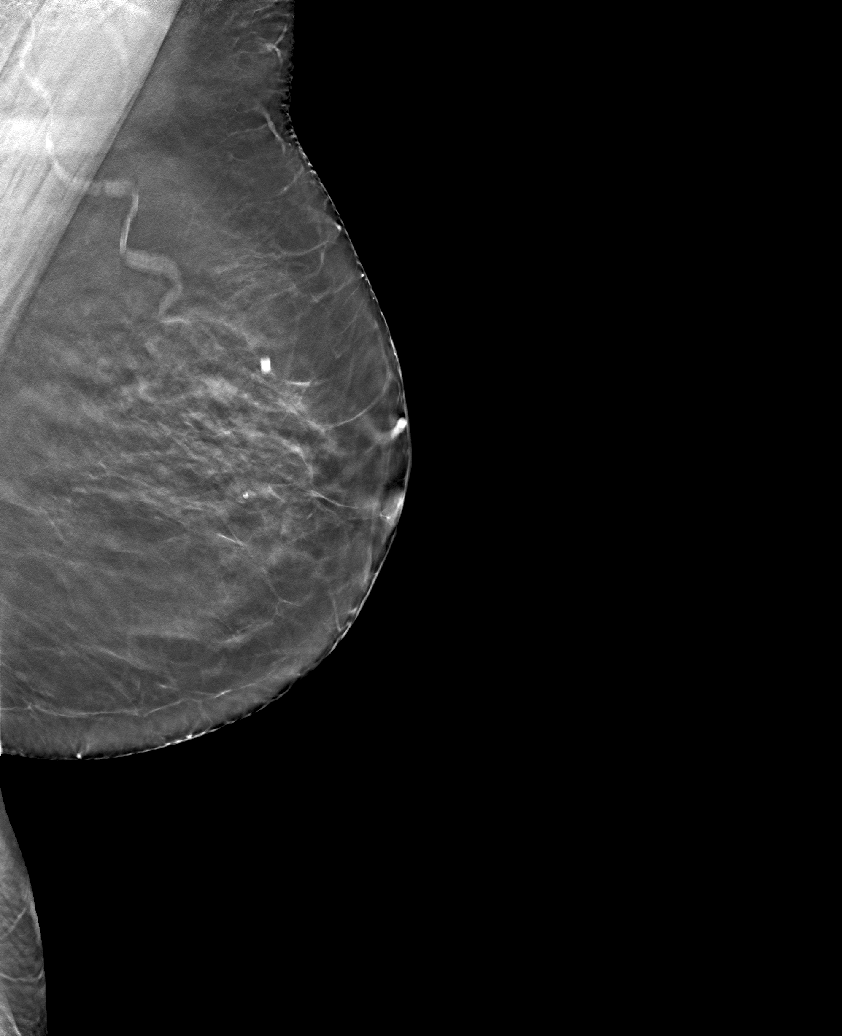

[6 of 30 positions shown; findings below may reference images not displayed]

ACR Breast Density Category b: There are scattered areas of
fibroglandular density.
FINDINGS: There are no findings suspicious for malignancy. Images were
processed with CAD.
IMPRESSION: No mammographic evidence of malignancy. A result letter of this
screening mammogram will be mailed directly to the patient.

RECOMMENDATION:
Screening mammogram in one year. (Code:[id number])

BI-RADS CATEGORY  1: Negative.

## 2021-01-03 ENCOUNTER — Ambulatory Visit: Payer: Self-pay | Admitting: Orthopedic Surgery

## 2021-04-22 ENCOUNTER — Encounter: Payer: Self-pay | Admitting: Orthopedic Surgery

## 2021-04-22 ENCOUNTER — Other Ambulatory Visit: Payer: Self-pay

## 2021-04-22 ENCOUNTER — Ambulatory Visit: Payer: PPO | Admitting: Orthopedic Surgery

## 2021-04-22 VITALS — BP 157/86 | HR 92 | Ht 63.5 in | Wt 199.2 lb

## 2021-04-22 DIAGNOSIS — M1710 Unilateral primary osteoarthritis, unspecified knee: Secondary | ICD-10-CM

## 2021-04-22 DIAGNOSIS — M23322 Other meniscus derangements, posterior horn of medial meniscus, left knee: Secondary | ICD-10-CM | POA: Diagnosis not present

## 2021-04-22 DIAGNOSIS — M1712 Unilateral primary osteoarthritis, left knee: Secondary | ICD-10-CM

## 2021-04-22 NOTE — Patient Instructions (Signed)
Ice as needed   Over the counter ibuprofen 800 mg as needed up to 3 x a day with food

## 2021-04-22 NOTE — Progress Notes (Signed)
Chief Complaint  Patient presents with   Surgery Consultation    Left knee    Encounter Diagnoses  Name Primary?   Primary localized osteoarthritis of knee Yes   Derangement of posterior horn of medial meniscus of left knee    Donna Mckay comes in with worsening knee pain.  She had an MRI which shows arthritis in the patellofemoral joint medial compartment and complete tear of the medial meniscus  Her symptoms include pain going up and down the stairs which worsened when she was painting and had to go up and down a ladder.  She also walked a lot on her [place] trip and this caused increased pain she feels a tightness and heaviness especially with bending the knee  Most of the pain however is in the front of the knee  Reexamination she has tenderness over the medial joint line she has crepitance and pain in the patellofemoral joint she has tenderness over the medial femoral condyle  After discussion of what arthroscopic surgery could do we decided to repeat the injection and let her take ibuprofen and/or Tylenol   Left knee injection  A steroid injection was performed at left knee lateral portal approach using 1% plain Lidocaine and 6 mg of Celestone. This was well tolerated.

## 2021-05-21 DIAGNOSIS — I10 Essential (primary) hypertension: Secondary | ICD-10-CM | POA: Diagnosis not present

## 2021-05-21 DIAGNOSIS — Z79899 Other long term (current) drug therapy: Secondary | ICD-10-CM | POA: Diagnosis not present

## 2021-05-28 DIAGNOSIS — Z0000 Encounter for general adult medical examination without abnormal findings: Secondary | ICD-10-CM | POA: Diagnosis not present

## 2021-05-28 DIAGNOSIS — E785 Hyperlipidemia, unspecified: Secondary | ICD-10-CM | POA: Diagnosis not present

## 2021-05-28 DIAGNOSIS — M1990 Unspecified osteoarthritis, unspecified site: Secondary | ICD-10-CM | POA: Diagnosis not present

## 2021-05-28 DIAGNOSIS — I10 Essential (primary) hypertension: Secondary | ICD-10-CM | POA: Diagnosis not present

## 2021-05-28 DIAGNOSIS — Z6834 Body mass index (BMI) 34.0-34.9, adult: Secondary | ICD-10-CM | POA: Diagnosis not present

## 2021-06-04 ENCOUNTER — Encounter: Payer: Self-pay | Admitting: *Deleted

## 2021-06-20 ENCOUNTER — Ambulatory Visit: Payer: PPO

## 2021-06-25 DIAGNOSIS — Z23 Encounter for immunization: Secondary | ICD-10-CM | POA: Diagnosis not present

## 2021-06-26 ENCOUNTER — Ambulatory Visit (INDEPENDENT_AMBULATORY_CARE_PROVIDER_SITE_OTHER): Payer: Self-pay | Admitting: *Deleted

## 2021-06-26 ENCOUNTER — Other Ambulatory Visit: Payer: Self-pay

## 2021-06-26 VITALS — Ht 63.5 in | Wt 203.4 lb

## 2021-06-26 DIAGNOSIS — Z1211 Encounter for screening for malignant neoplasm of colon: Secondary | ICD-10-CM

## 2021-06-26 NOTE — Progress Notes (Addendum)
Gastroenterology Pre-Procedure Review  Request Date: 06/26/2021 Requesting Physician: Dr. Ouida Sills, Last TCS done 05/07/2004 by Dr. Jena Gauss, normal colon, early diverticular changes, left colon  PATIENT REVIEW QUESTIONS: The patient responded to the following health history questions as indicated:    1. Diabetes Melitis: no 2. Joint replacements in the past 12 months: no 3. Major health problems in the past 3 months: no 4. Has an artificial valve or MVP: no 5. Has a defibrillator: no 6. Has been advised in past to take antibiotics in advance of a procedure like teeth cleaning: no 7. Family history of colon cancer: no  8. Alcohol Use: no 9. Illicit drug Use: no 10. History of sleep apnea: no  11. History of coronary artery or other vascular stents placed within the last 12 months: no 12. History of any prior anesthesia complications: yes, n/v 13. Body mass index is 35.47 kg/m.    MEDICATIONS & ALLERGIES:    Patient reports the following regarding taking any blood thinners:   Plavix? no Aspirin? no Coumadin? no Brilinta? no Xarelto? no Eliquis? no Pradaxa? no Savaysa? no Effient? no  Patient confirms/reports the following medications:  Current Outpatient Medications  Medication Sig Dispense Refill   amLODipine (NORVASC) 5 MG tablet Take 5 mg by mouth daily.     cetirizine (ZYRTEC) 10 MG tablet Take 10 mg by mouth daily.     esomeprazole (NEXIUM) 20 MG capsule Take 20 mg by mouth 3 (three) times a week.     lisinopril (ZESTRIL) 40 MG tablet Take 40 mg by mouth daily.     No current facility-administered medications for this visit.    Patient confirms/reports the following allergies:  Allergies  Allergen Reactions   Mucinex [Guaifenesin Er] Hives    No orders of the defined types were placed in this encounter.   AUTHORIZATION INFORMATION Primary Insurance: [employer],  ID #: [id number] Pre-Cert / Berkley Harvey required: No, not required  SCHEDULE  INFORMATION: Procedure has been scheduled as follows:  Date: 08/07/2021, Time: 9:30 Location: [facility] with Dr. Jena Gauss  This Gastroenterology Pre-Precedure Review Form is being routed to the following provider(s): Tana Coast, PA-C

## 2021-06-26 NOTE — Progress Notes (Signed)
Pt made aware that I will call her once procedure schedules have been released.

## 2021-06-26 NOTE — Progress Notes (Signed)
Ok to schedule conscious sedation. ASA II.  °

## 2021-07-18 ENCOUNTER — Encounter: Payer: Self-pay | Admitting: *Deleted

## 2021-07-18 MED ORDER — PEG 3350-KCL-NA BICARB-NACL 420 G PO SOLR: 4000.0000 mL | Freq: Once | ORAL | 0 refills | Status: AC

## 2021-07-18 NOTE — Addendum Note (Signed)
Addended by: Noreene Larsson on: 07/18/2021 11:47 AM   Modules accepted: Orders

## 2021-07-18 NOTE — Progress Notes (Signed)
Spoke to pt.  Scheduled procedure for 08/07/2021 with arrival at 8:30 am.  Pt made aware that I will mail out prep instructions to her.  She will call me if she has any questions.

## 2021-08-06 ENCOUNTER — Telehealth: Payer: Self-pay | Admitting: *Deleted

## 2021-08-06 ENCOUNTER — Telehealth: Payer: Self-pay

## 2021-08-06 NOTE — Telephone Encounter (Signed)
Received VM from pt about her procedure and having a cold  Called pt back and LMOVM

## 2021-08-06 NOTE — Telephone Encounter (Signed)
Pt called and left a message stating that she has a colonoscopy scheduled for Wed @ 9:30 and has cold symptoms. [name] for pt to return my call to get more information.

## 2021-08-06 NOTE — Telephone Encounter (Signed)
Patient returned call. She has a cold and advised will need to r/s. Patient taken off schedule for tomorrow. Aware Angie will call her to r/s.

## 2021-08-07 ENCOUNTER — Encounter (HOSPITAL_COMMUNITY): Admission: RE | Payer: Self-pay | Source: Ambulatory Visit

## 2021-08-07 ENCOUNTER — Ambulatory Visit (HOSPITAL_COMMUNITY): Admission: RE | Admit: 2021-08-07 | Payer: PPO | Source: Ambulatory Visit | Admitting: Internal Medicine

## 2021-08-07 SURGERY — COLONOSCOPY
Anesthesia: Moderate Sedation

## 2021-08-07 NOTE — Telephone Encounter (Signed)
See other note regarding cancellation of procedure.

## 2021-08-09 NOTE — Telephone Encounter (Signed)
Spoke to pt.  Informed her that we have availability for 08/21/2021.  Pt declined.  She will be out of town.  Informed her that I will contact her once we have Feb procedure schedules.  Pt voiced understanding.

## 2021-08-23 NOTE — Telephone Encounter (Signed)
Pt made aware that I will contact her once Mar/Apr procedures are available.

## 2021-08-26 ENCOUNTER — Other Ambulatory Visit (HOSPITAL_COMMUNITY): Payer: Self-pay | Admitting: Obstetrics & Gynecology

## 2021-08-26 DIAGNOSIS — Z1231 Encounter for screening mammogram for malignant neoplasm of breast: Secondary | ICD-10-CM

## 2021-08-27 ENCOUNTER — Encounter: Payer: Self-pay | Admitting: *Deleted

## 2021-08-27 ENCOUNTER — Telehealth: Payer: Self-pay | Admitting: Internal Medicine

## 2021-08-27 MED ORDER — CLENPIQ 10-3.5-12 MG-GM -GM/160ML PO SOLN: 1.0000 | Freq: Once | ORAL | 0 refills | Status: AC

## 2021-08-27 NOTE — Telephone Encounter (Signed)
Pt asked for a return call. [phone]

## 2021-08-27 NOTE — Addendum Note (Signed)
Addended by: Noreene Larsson on: 08/27/2021 03:28 PM   Modules accepted: Orders

## 2021-08-27 NOTE — Telephone Encounter (Signed)
Pt called back and informed me that she can not do procedure on 09/02/2021.  She forgot that she has company coming over on that day.  Pt requested to reschedule when we have Dr. Luvenia Starch schedules available.  She is aware that it will be around Mar/Apr.  She voiced understanding.  [name] of Donna Mckay to cancel procedure.

## 2021-08-27 NOTE — Telephone Encounter (Signed)
[name] for pt to call me back. 

## 2021-08-27 NOTE — Telephone Encounter (Signed)
Spoke to pt.  Scheduled procedure for 09/02/2021 with arrival at 7:45 am.  Pt requested low volume kit.  Sent RX for [employer] to Pharmacy.  Reviewed prep instructions with pt.  Informed her that they would be in my chart and mailing out instructions to her as well.

## 2021-09-02 ENCOUNTER — Ambulatory Visit (HOSPITAL_COMMUNITY): Payer: PPO

## 2021-09-02 ENCOUNTER — Ambulatory Visit (HOSPITAL_COMMUNITY)
Admission: RE | Admit: 2021-09-02 | Discharge: 2021-09-02 | Disposition: A | Discharge status: Home / Self Care | Payer: PPO | Source: Ambulatory Visit | Attending: Obstetrics & Gynecology | Admitting: Obstetrics & Gynecology

## 2021-09-02 ENCOUNTER — Other Ambulatory Visit: Payer: Self-pay

## 2021-09-02 ENCOUNTER — Ambulatory Visit (HOSPITAL_COMMUNITY): Admit: 2021-09-02 | Payer: PPO | Admitting: Internal Medicine

## 2021-09-02 ENCOUNTER — Encounter (HOSPITAL_COMMUNITY): Payer: Self-pay

## 2021-09-02 DIAGNOSIS — Z1231 Encounter for screening mammogram for malignant neoplasm of breast: Secondary | ICD-10-CM | POA: Diagnosis not present

## 2021-09-02 SURGERY — COLONOSCOPY WITH PROPOFOL
Anesthesia: Monitor Anesthesia Care

## 2021-10-03 ENCOUNTER — Encounter: Payer: Self-pay | Admitting: *Deleted

## 2021-10-03 NOTE — Telephone Encounter (Signed)
[name] for pt to call back. 

## 2021-10-03 NOTE — Progress Notes (Signed)
Spoke to pt.  No changes in medical history or medication list.  Scheduled procedure for 10/25/2021 with arrival at 6:30 at [facility].  Reviewed prep instructions with pt by phone.  Pt informed to pick up prep kit.  Pt aware that I am mailing out prep instructions.  Confirmed mailing address.

## 2021-10-03 NOTE — Telephone Encounter (Signed)
Spoke to pt.  No changes in medical history or medication list.  Scheduled procedure for 10/25/2021 with arrival at 6:30 at [facility].  Reviewed prep instructions with pt by phone.  Pt informed to pick up prep kit.  Pt aware that I am mailing out prep instructions.  Confirmed mailing address.   ?

## 2021-10-25 ENCOUNTER — Encounter (HOSPITAL_COMMUNITY): Payer: Self-pay | Admitting: Internal Medicine

## 2021-10-25 ENCOUNTER — Ambulatory Visit (HOSPITAL_BASED_OUTPATIENT_CLINIC_OR_DEPARTMENT_OTHER): Payer: PPO | Admitting: Anesthesiology

## 2021-10-25 ENCOUNTER — Ambulatory Visit (HOSPITAL_COMMUNITY): Payer: PPO | Admitting: Anesthesiology

## 2021-10-25 ENCOUNTER — Other Ambulatory Visit: Payer: Self-pay

## 2021-10-25 ENCOUNTER — Ambulatory Visit (HOSPITAL_COMMUNITY)
Admission: RE | Admit: 2021-10-25 | Discharge: 2021-10-25 | Disposition: A | Discharge status: Home / Self Care | Payer: PPO | Source: Ambulatory Visit | Attending: Internal Medicine | Admitting: Internal Medicine

## 2021-10-25 ENCOUNTER — Encounter (HOSPITAL_COMMUNITY)
Admission: RE | Disposition: A | Discharge status: Home / Self Care | Payer: Self-pay | Source: Ambulatory Visit | Attending: Internal Medicine

## 2021-10-25 DIAGNOSIS — Z1211 Encounter for screening for malignant neoplasm of colon: Secondary | ICD-10-CM | POA: Insufficient documentation

## 2021-10-25 DIAGNOSIS — K5730 Diverticulosis of large intestine without perforation or abscess without bleeding: Secondary | ICD-10-CM | POA: Insufficient documentation

## 2021-10-25 DIAGNOSIS — Z87891 Personal history of nicotine dependence: Secondary | ICD-10-CM | POA: Diagnosis not present

## 2021-10-25 DIAGNOSIS — I10 Essential (primary) hypertension: Secondary | ICD-10-CM | POA: Diagnosis not present

## 2021-10-25 HISTORY — PX: COLONOSCOPY WITH PROPOFOL: SHX5780

## 2021-10-25 HISTORY — DX: Other specified postprocedural states: Z98.890

## 2021-10-25 SURGERY — COLONOSCOPY WITH PROPOFOL
Anesthesia: General

## 2021-10-25 MED ORDER — PROPOFOL 500 MG/50ML IV EMUL
INTRAVENOUS | Status: DC | PRN
  Administered 2021-10-25: 150 ug/kg/min via INTRAVENOUS

## 2021-10-25 MED ORDER — LIDOCAINE HCL (CARDIAC) PF 100 MG/5ML IV SOSY
PREFILLED_SYRINGE | INTRAVENOUS | Status: DC | PRN
Start: 2021-10-25 — End: 2021-10-25
  Administered 2021-10-25: 50 mg via INTRAVENOUS

## 2021-10-25 MED ORDER — LACTATED RINGERS IV SOLN: INTRAVENOUS | Status: DC

## 2021-10-25 MED ORDER — PHENYLEPHRINE 40 MCG/ML (10ML) SYRINGE FOR IV PUSH (FOR BLOOD PRESSURE SUPPORT)
PREFILLED_SYRINGE | INTRAVENOUS | Status: AC
  Filled 2021-10-25: qty 10

## 2021-10-25 MED ORDER — LACTATED RINGERS IV SOLN
INTRAVENOUS | Status: DC | PRN
Start: 2021-10-25 — End: 2021-10-25

## 2021-10-25 MED ORDER — PROPOFOL 10 MG/ML IV BOLUS
INTRAVENOUS | Status: DC | PRN
Start: 2021-10-25 — End: 2021-10-25
  Administered 2021-10-25: 100 mg via INTRAVENOUS

## 2021-10-25 MED ORDER — LIDOCAINE HCL (PF) 2 % IJ SOLN
INTRAMUSCULAR | Status: AC
  Filled 2021-10-25: qty 5

## 2021-10-25 MED ORDER — PHENYLEPHRINE HCL (PRESSORS) 10 MG/ML IV SOLN
INTRAVENOUS | Status: DC | PRN
  Administered 2021-10-25 (×3): 80 ug via INTRAVENOUS

## 2021-10-25 NOTE — Discharge Instructions (Addendum)
?  Colonoscopy ?Discharge Instructions ? ?Read the instructions outlined below and refer to this sheet in the next few weeks. These discharge instructions provide you with general information on caring for yourself after you leave the hospital. Your doctor may also give you specific instructions. While your treatment has been planned according to the most current medical practices available, unavoidable complications occasionally occur. If you have any problems or questions after discharge, call Dr. Gala Romney at [phone]. ?ACTIVITY ?You may resume your regular activity, but move at a slower pace for the next 24 hours.  ?Take frequent rest periods for the next 24 hours.  ?Walking will help get rid of the air and reduce the bloated feeling in your belly (abdomen).  ?No driving for 24 hours (because of the medicine (anesthesia) used during the test).   ?Do not sign any important legal documents or operate any machinery for 24 hours (because of the anesthesia used during the test).  ?NUTRITION ?Drink plenty of fluids.  ?You may resume your normal diet as instructed by your doctor.  ?Begin with a light meal and progress to your normal diet. Heavy or fried foods are harder to digest and may make you feel sick to your stomach (nauseated).  ?Avoid alcoholic beverages for 24 hours or as instructed.  ?MEDICATIONS ?You may resume your normal medications unless your doctor tells you otherwise.  ?WHAT YOU CAN EXPECT TODAY ?Some feelings of bloating in the abdomen.  ?Passage of more gas than usual.  ?Spotting of blood in your stool or on the toilet paper.  ?IF YOU HAD POLYPS REMOVED DURING THE COLONOSCOPY: ?No aspirin products for 7 days or as instructed.  ?No alcohol for 7 days or as instructed.  ?Eat a soft diet for the next 24 hours.  ?FINDING OUT THE RESULTS OF YOUR TEST ?Not all test results are available during your visit. If your test results are not back during the visit, make an appointment with your caregiver to find out the  results. Do not assume everything is normal if you have not heard from your caregiver or the medical facility. It is important for you to follow up on all of your test results.  ?SEEK IMMEDIATE MEDICAL ATTENTION IF: ?You have more than a spotting of blood in your stool.  ?Your belly is swollen (abdominal distention).  ?You are nauseated or vomiting.  ?You have a temperature over 101.  ?You have abdominal pain or discomfort that is severe or gets worse throughout the day.   ? ? ?Diverticulosis only today.  No polyps. ? ?I recommend 1 more colonoscopy in 10 years for screening purposes ? ?At patient request, I called Esmi Evertsen at [phone] -rolled to voicemail; left a message. ?

## 2021-10-25 NOTE — Anesthesia Preprocedure Evaluation (Addendum)
Anesthesia Evaluation  ?Patient identified by MRN, date of birth, ID band ?Patient awake ? ? ? ?Reviewed: ?Allergy & Precautions, NPO status , Patient's Chart, lab work & pertinent test results ? ?History of Anesthesia Complications ?(+) PONV and history of anesthetic complications ? ?Airway ?Mallampati: II ? ?TM Distance: >3 FB ?Neck ROM: Full ? ? ? Dental ? ?(+) Dental Advisory Given ?No notable dental injury:   ?Pulmonary ?former smoker,  ?  ?Pulmonary exam normal ?breath sounds clear to auscultation ? ? ? ? ? ? Cardiovascular ?Exercise Tolerance: Good ?hypertension, Pt. on medications ?Normal cardiovascular exam ?Rhythm:Regular Rate:Normal ? ? ?  ?Neuro/Psych ?negative neurological ROS ? negative psych ROS  ? GI/Hepatic ?negative GI ROS, Neg liver ROS,   ?Endo/Other  ?negative endocrine ROS ? Renal/GU ?negative Renal ROS  ?negative genitourinary ?  ?Musculoskeletal ?negative musculoskeletal ROS ?(+)  ? Abdominal ?  ?Peds ?negative pediatric ROS ?(+)  Hematology ?negative hematology ROS ?(+)   ?Anesthesia Other Findings ? ? Reproductive/Obstetrics ?negative OB ROS ? ?  ? ? ? ? ? ? ? ? ? ? ? ? ? ?  ?  ? ? ? ? ? ? ? ?Anesthesia Physical ?Anesthesia Plan ? ?ASA: 2 ? ?Anesthesia Plan: General  ? ?Post-op Pain Management: Minimal or no pain anticipated  ? ?Induction: Intravenous ? ?PONV Risk Score and Plan: TIVA ? ?Airway Management Planned: Nasal Cannula and Natural Airway ? ?Additional Equipment:  ? ?Intra-op Plan:  ? ?Post-operative Plan:  ? ?Informed Consent: I have reviewed the patients History and Physical, chart, labs and discussed the procedure including the risks, benefits and alternatives for the proposed anesthesia with the patient or authorized representative who has indicated his/her understanding and acceptance.  ? ? ? ?Dental advisory given ? ?Plan Discussed with: CRNA and Surgeon ? ?Anesthesia Plan Comments:   ? ? ? ? ? ? ?Anesthesia Quick Evaluation ? ?

## 2021-10-25 NOTE — H&P (Signed)
@LOGO @ ? ? ?Primary Care Physician:  , MD ?Primary Gastroenterologist:  Dr.  Carylon Perches ?Pre-Procedure History & Physical: ?HPI:  Donna Mckay is a 66 y.o. female is here for a screening colonoscopy.  No bowel symptoms.  No family history colon cancer.  Colonoscopy in 2005 reportedly negative (old records not available in the system) ? ?Past Medical History:  ?Diagnosis Date  ? Allergy   ? Hypertension   ? PONV (postoperative nausea and vomiting)   ? ? ?Past Surgical History:  ?Procedure Laterality Date  ? ENDOMETRIAL ABLATION    ? TUBAL LIGATION    ? ? ?Prior to Admission medications   ?Medication Sig Start Date End Date Taking? Authorizing Provider  ?amLODipine (NORVASC) 5 MG tablet Take 5 mg by mouth daily. 02/19/20  Yes [provider]  ?cetirizine (ZYRTEC) 10 MG tablet Take 10 mg by mouth daily.   Yes [provider]  ?Cholecalciferol (VITAMIN D) 125 MCG (5000 UT) CAPS Take 5,000 Units by mouth every other day.   Yes [provider]  ?esomeprazole (NEXIUM) 20 MG capsule Take 20 mg by mouth every other day.   Yes [provider]  ?ibuprofen (ADVIL) 200 MG tablet Take 400-800 mg by mouth every 6 (six) hours as needed for moderate pain.   Yes [provider]  ?lisinopril (ZESTRIL) 40 MG tablet Take 40 mg by mouth daily. 02/15/20  Yes [provider]  ? ? ?Allergies as of 10/03/2021 - Review Complete 07/29/2021  ?Allergen Reaction Noted  ? Mucinex [guaifenesin er] Hives 01/25/2016  ? ? ?Family History  ?Problem Relation Age of Onset  ? Heart disease Mother   ? Hypertension Mother   ? Hypertension Father   ? Diabetes Father   ? Heart disease Father   ? Cancer Father   ?     prostate  ? Cancer Brother   ? Cancer Maternal Aunt   ?     breast  ? Hypertension Sister   ? Hypertension Son   ? Stroke Son   ? ? ?Social History  ? ?Socioeconomic History  ? Marital status: Married  ?  Spouse name: Not on file  ? Number of children: Not on file  ? Years of education: Not on  file  ? Highest education level: Not on file  ?Occupational History  ? Not on file  ?Tobacco Use  ? Smoking status: Former  ?  Types: Cigarettes  ? Smokeless tobacco: Never  ?Vaping Use  ? Vaping Use: Never used  ?Substance and Sexual Activity  ? Alcohol use: No  ? Drug use: No  ? Sexual activity: Yes  ?  Birth control/protection: Surgical  ?Other Topics Concern  ? Not on file  ?Social History Narrative  ? Not on file  ? ?Social Determinants of Health  ? ?Financial Resource Strain: Not on file  ?Food Insecurity: Not on file  ?Transportation Needs: Not on file  ?Physical Activity: Not on file  ?Stress: Not on file  ?Social Connections: Not on file  ?Intimate Partner Violence: Not on file  ? ? ?Review of Systems: ?See HPI, otherwise negative ROS ? ?Physical Exam: ?BP (!) 145/65   Pulse 98   Temp 98.3 ?F (36.8 ?C) (Oral)   Resp 20   Ht 5' 3.5" (1.613 m)   Wt 89.8 kg   SpO2 97%   BMI 34.52 kg/m?  ?General:   Alert,  Well-developed, well-nourished, pleasant and cooperative in NAD ?01/27/2016 ?Neck:  Supple; no masses or  thyromegaly. ?Lungs:  Clear throughout to auscultation.   No wheezes, crackles, or rhonchi. No acute distress. ?Heart:  Regular rate and rhythm; no murmurs, clicks, rubs,  or gallops. ?Abdomen:  Soft, nontender and nondistended. No masses, hepatosplenomegaly or hernias noted. Normal bowel sounds, without guarding, and without rebound.   ? ?Impression/Plan: ?AASIA PEAVLER is now here to undergo a screening colonoscopy.  Average risk screening examination. ? ?Risks, benefits, limitations, imponderables and alternatives regarding colonoscopy have been reviewed with the patient. Questions have been answered. All parties agreeable.  ? ? ? ?Notice:  This dictation was prepared with Dragon dictation along with smaller phrase technology. Any transcriptional errors that result from this process are unintentional and may not be corrected upon review.  ? ?

## 2021-10-25 NOTE — Transfer of Care (Signed)
Immediate Anesthesia Transfer of Care Note ? ?Patient: Donna Mckay ? ?Procedure(s) Performed: COLONOSCOPY WITH PROPOFOL ? ?Patient Location: Endoscopy Unit ? ?Anesthesia Type:General ? ?Level of Consciousness: awake ? ?Airway & Oxygen Therapy: Patient Spontanous Breathing ? ?Post-op Assessment: Report given to RN and Post -op Vital signs reviewed and stable ? ?Post vital signs: Reviewed and stable ? ?Last Vitals:  ?Vitals Value Taken Time  ?BP    ?Temp    ?Pulse 74   ?Resp 14   ?SpO2 98%   ? ? ?Last Pain:  ?Vitals:  ? 10/25/21 0732  ?TempSrc:   ?PainSc: 0-No pain  ?   ? ?Patients Stated Pain Goal: 5 (10/25/21 [id number]) ? ?Complications: No notable events documented. ?

## 2021-10-25 NOTE — Op Note (Signed)
[facility] ?Patient Name: Donna Mckay ?Procedure Date: 10/25/2021 7:02 AM ?MRN: [id number] ?Date of Birth: [birth year] ?Attending MD: Gennette Pac , MD ?CSN: [id number] ?Age: 66 ?Admit Type: Outpatient ?Procedure:                Colonoscopy ?Indications:              Screening for colorectal malignant neoplasm ?Providers:                Gennette Pac, MD, Nena Polio, RN, Archie Patten  ?                          Wilson ?Referring MD:              ?Medicines:                Propofol per Anesthesia ?Complications:            No immediate complications. ?Estimated Blood Loss:     Estimated blood loss: none. ?Procedure:                Pre-Anesthesia Assessment: ?                          - Prior to the procedure, a History and Physical  ?                          was performed, and patient medications and  ?                          allergies were reviewed. The patient's tolerance of  ?                          previous anesthesia was also reviewed. The risks  ?                          and benefits of the procedure and the sedation  ?                          options and risks were discussed with the patient.  ?                          All questions were answered, and informed consent  ?                          was obtained. Prior Anticoagulants: The patient has  ?                          taken no previous anticoagulant or antiplatelet  ?                          agents. ASA Grade Assessment: II - A patient with  ?                          mild systemic disease. After reviewing the risks  ?  and benefits, the patient was deemed in  ?                          satisfactory condition to undergo the procedure. ?                          After obtaining informed consent, the colonoscope  ?                          was passed under direct vision. Throughout the  ?                          procedure, the patient's blood pressure, pulse, and  ?                          oxygen saturations  were monitored continuously. The  ?                          [id number]) scope was introduced through  ?                          the anus and advanced to the the cecum, identified  ?                          by appendiceal orifice and ileocecal valve. The  ?                          colonoscopy was performed without difficulty. The  ?                          patient tolerated the procedure well. The ileocecal  ?                          valve, appendiceal orifice, and rectum were  ?                          photographed. ?Scope In: 7:37:02 AM ?Scope Out: 7:48:04 AM ?Scope Withdrawal Time: 0 hours 6 minutes 51 seconds  ?Total Procedure Duration: 0 hours 11 minutes 2 seconds  ?Findings: ?     The perianal and digital rectal examinations were normal. ?     Scattered small and large-mouthed diverticula were found in the sigmoid  ?     colon and descending colon. ?     The exam was otherwise without abnormality on direct and retroflexion  ?     views. ?Impression:               - Diverticulosis in the sigmoid colon and in the  ?                          descending colon. ?                          - The examination was otherwise normal on direct  ?  and retroflexion views. ?                          - No specimens collected. ?Moderate Sedation: ?     Moderate (conscious) sedation was personally administered by an  ?     anesthesia professional. The following parameters were monitored: oxygen  ?     saturation, heart rate, blood pressure, respiratory rate, EKG, adequacy  ?     of pulmonary ventilation, and response to care. ?Recommendation:           - Patient has a contact number available for  ?                          emergencies. The signs and symptoms of potential  ?                          delayed complications were discussed with the  ?                          patient. Return to normal activities tomorrow.  ?                          Written discharge instructions were provided  to the  ?                          patient. ?                          - Resume previous diet. ?                          - Continue present medications. ?                          - Repeat colonoscopy in 10 years for screening  ?                          purposes. ?                          - Return to GI office PRN. ?Procedure Code(s):        --- Professional --- ?                          [phone], Colonoscopy, flexible; diagnostic, including  ?                          collection of specimen(s) by brushing or washing,  ?                          when performed (separate procedure) ?Diagnosis Code(s):        --- Professional --- ?                          Z12.11, Encounter for screening for malignant  ?  neoplasm of colon ?                          K57.30, Diverticulosis of large intestine without  ?                          perforation or abscess without bleeding ?CPT copyright 2019 American Medical Association. All rights reserved. ?The codes documented in this report are preliminary and upon coder review may  ?be revised to meet current compliance requirements. ?Gerrit Friends. Kairee Kozma, MD ?Gennette Pac, MD ?10/25/2021 7:55:42 AM ?This report has been signed electronically. ?Number of Addenda: 0 ?

## 2021-10-25 NOTE — Anesthesia Postprocedure Evaluation (Signed)
Anesthesia Post Note ? ?Patient: Donna Mckay ? ?Procedure(s) Performed: COLONOSCOPY WITH PROPOFOL ? ?Patient location during evaluation: Endoscopy ?Anesthesia Type: General ?Level of consciousness: awake and alert and oriented ?Pain management: pain level controlled ?Vital Signs Assessment: post-procedure vital signs reviewed and stable ?Respiratory status: spontaneous breathing, nonlabored ventilation and respiratory function stable ?Cardiovascular status: blood pressure returned to baseline and stable ?Postop Assessment: no apparent nausea or vomiting ?Anesthetic complications: no ? ? ?No notable events documented. ? ? ?Last Vitals:  ?Vitals:  ? 10/25/21 0655 10/25/21 0751  ?BP: (!) 145/65 (!) 97/48  ?Pulse: 98   ?Resp: 20 14  ?Temp: 36.8 ?C 36.6 ?C  ?SpO2: 97% 93%  ?  ?Last Pain:  ?Vitals:  ? 10/25/21 0751  ?TempSrc: Axillary  ?PainSc: 0-No pain  ? ? ?  ?  ?  ?  ?  ?  ? ?Kimala Horne C Susie Pousson ? ? ? ? ?

## 2021-10-30 ENCOUNTER — Encounter (HOSPITAL_COMMUNITY): Payer: Self-pay | Admitting: Internal Medicine

## 2021-11-18 ENCOUNTER — Other Ambulatory Visit: Payer: Self-pay

## 2021-11-18 ENCOUNTER — Emergency Department (HOSPITAL_COMMUNITY)
Admission: EM | Admit: 2021-11-18 | Discharge: 2021-11-18 | Disposition: A | Discharge status: Home / Self Care | Payer: PPO | Attending: Emergency Medicine | Admitting: Emergency Medicine

## 2021-11-18 ENCOUNTER — Emergency Department (HOSPITAL_COMMUNITY): Payer: PPO

## 2021-11-18 ENCOUNTER — Encounter (HOSPITAL_COMMUNITY): Payer: Self-pay

## 2021-11-18 DIAGNOSIS — Z79899 Other long term (current) drug therapy: Secondary | ICD-10-CM | POA: Insufficient documentation

## 2021-11-18 DIAGNOSIS — R739 Hyperglycemia, unspecified: Secondary | ICD-10-CM | POA: Diagnosis not present

## 2021-11-18 DIAGNOSIS — D72819 Decreased white blood cell count, unspecified: Secondary | ICD-10-CM | POA: Insufficient documentation

## 2021-11-18 DIAGNOSIS — R0789 Other chest pain: Secondary | ICD-10-CM | POA: Diagnosis not present

## 2021-11-18 DIAGNOSIS — R079 Chest pain, unspecified: Secondary | ICD-10-CM | POA: Diagnosis not present

## 2021-11-18 DIAGNOSIS — I10 Essential (primary) hypertension: Secondary | ICD-10-CM | POA: Diagnosis not present

## 2021-11-18 LAB — BASIC METABOLIC PANEL
Anion gap: 7 (ref 5–15)
BUN: 10 mg/dL (ref 8–23)
CO2: 24 mmol/L (ref 22–32)
Calcium: 9 mg/dL (ref 8.9–10.3)
Chloride: 108 mmol/L (ref 98–111)
Creatinine, Ser: 0.77 mg/dL (ref 0.44–1.00)
GFR, Estimated: >60 mL/min (ref >60)
Glucose, Bld: 106 mg/dL — ABNORMAL HIGH (ref 70–99)
Potassium: 3.6 mmol/L (ref 3.5–5.1)
Sodium: 139 mmol/L (ref 135–145)

## 2021-11-18 LAB — TROPONIN I (HIGH SENSITIVITY)
Troponin I (High Sensitivity): 3 ng/L (ref <18)
Troponin I (High Sensitivity): 4 ng/L (ref <18)

## 2021-11-18 LAB — CBC
HCT: 37.4 % (ref 36.0–46.0)
Hemoglobin: 12.3 g/dL (ref 12.0–15.0)
MCH: 28.1 pg (ref 26.0–34.0)
MCHC: 32.9 g/dL (ref 30.0–36.0)
MCV: 85.4 fL (ref 80.0–100.0)
Platelets: 262 10*3/uL (ref 150–400)
RBC: 4.38 MIL/uL (ref 3.87–5.11)
RDW: 13.2 % (ref 11.5–15.5)
WBC: 3.9 10*3/uL — ABNORMAL LOW (ref 4.0–10.5)
nRBC: 0 % (ref 0.0–0.2)

## 2021-11-18 NOTE — ED Provider Notes (Signed)
?[facility] ?Provider Note ? ? ?CSN: [id number] ?Arrival date & time: 11/18/21  1057 ? ?  ? ?History ?Chief Complaint  ?Patient presents with  ? Chest Pain  ? ? ?Donna Mckay is a 66 y.o. female with history of hypertension presents the emergency department for evaluation of chest pain intermittently for the past 3 weeks.  Reports she had an episode of chest pain on March 31 that lasted around 1 minute.  She reports its like a squeezing sensation in her upper chest that was not associated with any diaphoresis, palpitations, nausea, vomiting, shortness of breath, or lightheadedness.  She reported she had not had another episode until this past Saturday, then had another episode on Sunday, followed by another 1 today.  She reports that these episodes can happen at random and has no exacerbating or relieving factors.  She is to symptoms it radiates into her left upper shoulder, but still denies that these come with any other accompanying symptoms.  She denies any other cardiac history other than her hypertension.  She follows up with a primary care provider regularly.  She discussed the case with their PCP who advised her to come to the emergency department to be evaluated. ? ? ?Chest Pain ?Associated symptoms: no abdominal pain, no back pain, no cough, no dizziness, no fever, no headache, no nausea, no numbness, no palpitations, no shortness of breath, no vomiting and no weakness   ? ?  ? ?Home Medications ?Prior to Admission medications   ?Medication Sig Start Date End Date Taking? Authorizing Provider  ?albuterol (VENTOLIN HFA) 108 (90 Base) MCG/ACT inhaler Inhale 2 puffs into the lungs every 4 (four) hours as needed for wheezing or shortness of breath. 10/06/21  Yes [provider]  ?amLODipine (NORVASC) 5 MG tablet Take 5 mg by mouth daily. 02/19/20  Yes [provider]  ?cetirizine (ZYRTEC) 10 MG tablet Take 10 mg by mouth daily.   Yes [provider]  ?Cholecalciferol  (VITAMIN D) 125 MCG (5000 UT) CAPS Take 5,000 Units by mouth every other day.   Yes [provider]  ?esomeprazole (NEXIUM) 20 MG capsule Take 20 mg by mouth every other day.   Yes [provider]  ?ibuprofen (ADVIL) 200 MG tablet Take 400-800 mg by mouth every 6 (six) hours as needed for moderate pain.   Yes [provider]  ?lisinopril (ZESTRIL) 40 MG tablet Take 40 mg by mouth daily. 02/15/20  Yes [provider]  ?   ? ?Allergies    ?Mucinex [guaifenesin er]   ? ?Review of Systems   ?Review of Systems  ?Constitutional:  Negative for chills and fever.  ?Respiratory:  Negative for cough, chest tightness and shortness of breath.   ?Cardiovascular:  Positive for chest pain. Negative for palpitations.  ?Gastrointestinal:  Negative for abdominal pain, nausea and vomiting.  ?Musculoskeletal:  Negative for back pain and neck pain.  ?Neurological:  Negative for dizziness, syncope, weakness, numbness and headaches.  ? ?Physical Exam ?Updated Vital Signs ?BP (!) 128/55   Pulse 75   Temp 98.1 ?F (36.7 ?C) (Oral)   Resp 13   Ht 5\' 3"  (1.6 m)   Wt 89.8 kg   SpO2 97%   BMI 35.07 kg/m?  ?Physical Exam ?Vitals and nursing note reviewed.  ?Constitutional:   ?   General: She is not in acute distress. ?   Appearance: Normal appearance. She is not ill-appearing or toxic-appearing.  ?HENT:  ?   Head: Normocephalic  and atraumatic.  ?   Mouth/Throat:  ?   Mouth: Mucous membranes are moist.  ?Eyes:  ?   General: No scleral icterus. ?Cardiovascular:  ?   Rate and Rhythm: Normal rate and regular rhythm.  ?   Pulses: Normal pulses.  ?   Heart sounds: No murmur heard. ?Pulmonary:  ?   Effort: Pulmonary effort is normal. No respiratory distress.  ?   Breath sounds: Normal breath sounds. No wheezing.  ?Chest:  ?   Chest wall: No tenderness.  ?Abdominal:  ?   General: Bowel sounds are normal.  ?   Palpations: Abdomen is soft.  ?   Tenderness: There is no abdominal tenderness. There is no guarding or  rebound.  ?Musculoskeletal:     ?   General: No deformity.  ?   Cervical back: Normal range of motion. No tenderness.  ?   Right lower leg: No edema.  ?   Left lower leg: No edema.  ?Skin: ?   General: Skin is warm and dry.  ?Neurological:  ?   General: No focal deficit present.  ?   Mental Status: She is alert. Mental status is at baseline.  ? ? ?ED Results / Procedures / Treatments   ?Labs ?(all labs ordered are listed, but only abnormal results are displayed) ?Labs Reviewed  ?BASIC METABOLIC PANEL - Abnormal; Notable for the following components:  ?    Result Value  ? Glucose, Bld 106 (*)   ? All other components within normal limits  ?CBC - Abnormal; Notable for the following components:  ? WBC 3.9 (*)   ? All other components within normal limits  ?TROPONIN I (HIGH SENSITIVITY)  ?TROPONIN I (HIGH SENSITIVITY)  ? ? ?EKG ?EKG Interpretation ? ?Date/Time:  Monday November 18 2021 11:09:54 EDT ?Ventricular Rate:  101 ?PR Interval:  166 ?QRS Duration: 76 ?QT Interval:  354 ?QTC Calculation: 459 ?R Axis:   35 ?Text Interpretation: Sinus tachycardia Otherwise normal ECG No previous ECGs available Confirmed by Vanetta MuldersZackowski, Scott [phone]([id number]) on 11/18/2021 5:41:05 PM ? ?Radiology ?DG Chest 2 View ? ?Result Date: 11/18/2021 ?CLINICAL DATA:  Chest pain. EXAM: CHEST - 2 VIEW COMPARISON:  None available FINDINGS: Cardiac silhouette and mediastinal contours are within normal limits. Mild calcification within aortic arch. The lungs are clear. Moderate multilevel degenerative disc changes of the mid to lower thoracic spine. Mild dextrocurvature of the thoracolumbar junction. No acute skeletal abnormality. IMPRESSION: No active cardiopulmonary disease. Electronically Signed   By: Neita Garnetonald  Viola M.D.   On: 11/18/2021 11:56   ? ?Procedures ?Procedures  ? ?Medications Ordered in ED ?Medications - No data to display ? ?ED Course/ Medical Decision Making/ A&P ?  ?                        ?Medical Decision Making ?Amount and/or Complexity of Data  Reviewed ?Labs: ordered. ?Radiology: ordered. ? ? ?66 year old female presents the emergency department for evaluation of intermittent chest pain for the past 3 weeks.  Differential diagnosis includes but is not limited to ACS, arrhythmia, GERD, costochondritis, pneumonia, pneumothorax, dissection.  Vital signs show mild hypertension at 128/55.  Afebrile, normal pulse rate, satting well on room air without any increased work of breathing.  Physical exam is pertinent for well-appearing patient.  Regular rate and rhythm.  Radial and DP and PT pulses intact.  No chest tenderness.  Lungs are clear to auscultation bilaterally.  Compartments are soft.  No lower leg edema. ? ?  I independently reviewed and interpreted the patient's labs and imaging and agree with radiologist interpretation.  CBC shows slight leukopenia with a white blood cell count of 3.9.  No anemia.  BMP shows mild elevated glucose at 106 although this is not fasting.  No electrolyte derangement.  Initial troponin at 3 with repeat of 4, delta 1.  EKG shows sinus tachycardia, otherwise normal.  Chest x-ray shows no acute cardiopulmonary process.  No focal consolidation or pneumothorax visualized. ? ?Given the reassuring troponins and EKG, less likely ACS.  The story and presentation does not sound like a dissection.  She is low Wells criteria.  I doubt any pneumonia or pneumothorax given the physical exam findings, story, and reassuring chest x-ray.  Chest is nontender, doubt costochondritis. ? ?I had shared decision-making with the patient and spouse in the room about admission versus close outpatient follow-up with cardiology.  Discussed risk and benefits of both.  Patient and spouse would like to follow-up outpatient with cardiologist, I think this is reasonable. ? ?Information for cardiologist included in the discharge paperwork.  Strict return precautions were discussed with the patient.  Patient repeated her precautions back to me, verbalized  understanding, and agrees to plan.  Patient is stable and being discharged home in good condition. ? ?I discussed this case with my attending physician who cosigned this note including patient's presenting symptoms, p

## 2021-11-18 NOTE — ED Triage Notes (Signed)
Pt reports intermittent chest pain since march 31.  Reports had pain again this past Saturday and went away with rest.  Reports pain came back Sunday afternoon and today.  Reports tightness in center of chest.  Says sometimes it radiates to left side of neck.  Pt says she can feel it start to "Build" in her chest.  ?

## 2021-11-18 NOTE — Discharge Instructions (Addendum)
You were seen here today for evaluation of your chest pain. Your lab work was unremarkable. Your CXR did not shows any signs of what could be causing your chest pain. We discussed possible admission versus following up outpatient and you chose to follow up outpatient. I have placed an ambulatory referral for the cardiologist in [place]. If you do not hear from them in 24-48 hours, please consult your PCP for a cardiologist referral. If this pain lasts longer than 10 minutes, you feel SOB, lightheaded, or your ehart is racing, please return to the ER immediately for evaluation.  ? ?Contact a doctor if: ?Your chest pain does not go away. ?You feel depressed. ?You have a fever. ?Get help right away if: ?Your chest pain is worse. ?You have a cough that gets worse, or you cough up blood. ?You have very bad (severe) pain in your belly (abdomen). ?You pass out (faint). ?You have either of these for no clear reason: ?Sudden chest discomfort. ?Sudden discomfort in your arms, back, neck, or jaw. ?You have shortness of breath at any time. ?You suddenly start to sweat, or your skin gets clammy. ?You feel sick to your stomach (nauseous). ?You throw up (vomit). ?You suddenly feel lightheaded or dizzy. ?You feel very weak or tired. ?Your heart starts to beat fast, or it feels like it is skipping beats. ?These symptoms may be an emergency. Do not wait to see if the symptoms will go away. Get medical help right away. Call your local emergency services (911 in the [place] Do not drive yourself to the hospital. ?

## 2021-11-26 DIAGNOSIS — R079 Chest pain, unspecified: Secondary | ICD-10-CM | POA: Diagnosis not present

## 2021-11-26 DIAGNOSIS — I10 Essential (primary) hypertension: Secondary | ICD-10-CM | POA: Diagnosis not present

## 2022-01-28 DIAGNOSIS — H0011 Chalazion right upper eyelid: Secondary | ICD-10-CM | POA: Diagnosis not present

## 2022-01-28 DIAGNOSIS — R070 Pain in throat: Secondary | ICD-10-CM | POA: Diagnosis not present

## 2022-01-31 ENCOUNTER — Ambulatory Visit: Payer: PPO | Admitting: Nurse Practitioner

## 2022-02-02 NOTE — Progress Notes (Signed)
Cardiology Office Note:   Date:  02/03/2022  NAME:  Donna Mckay    MRN: [id number] DOB:  [birth year]   PCP:  Carylon Perches, MD  Cardiologist:  None  Electrophysiologist:  None   Referring MD: Achille Rich, PA-C   Chief Complaint  Patient presents with   Chest Pain    History of Present Illness:   Donna Mckay is a 66 y.o. female with a hx of hypertension who is being seen today for the evaluation of chest pain at the request of Carylon Perches, MD. she reports periodic episodes of chest pain since March.  Apparently in March she was in the grocery store walking.  She describes sharp left-sided chest discomfort.  Also described as tightness.  She reports symptoms lasted several minutes and resolved.  She has had several episodes since that time.  Reports similar situations in which pain can occur randomly.  Can occur at rest.  Can occur with exertion.  Last several minutes and goes away.  She has not had any further chest discomfort in the past few weeks.  Now having back pain.  She also reports exertional shortness of breath.  She reports she is not wanting to exert herself.  Medical history is significant for high blood pressure.  She is not diabetic.  No personal history of heart attack or stroke.  She is a never smoker.  No alcohol or drug use is reported.  She works as a Neurosurgeon.  Her husband is a Education officer, environmental.  She reports her mother and father had heart disease.  She is concerned about this.  I do not have any laboratory data from her primary care physician office but she tells me everything was normal.  CV exam is normal.  EKG unremarkable.  No signs of congestive heart failure.  Symptoms of shortness of breath are still occurring.  Now with back pain.  No chest discomfort in the past 1 to 2 weeks.  Past Medical History: Past Medical History:  Diagnosis Date   Allergy    Hypertension    PONV (postoperative nausea and vomiting)     Past Surgical History: Past Surgical History:  Procedure  Laterality Date   COLONOSCOPY WITH PROPOFOL N/A 10/25/2021   Procedure: COLONOSCOPY WITH PROPOFOL;  Surgeon: Corbin Ade, MD;  Location: [facility];  Service: Endoscopy;  Laterality: N/A;  7:30 / ASA 2   ENDOMETRIAL ABLATION     TUBAL LIGATION      Current Medications: Current Meds  Medication Sig   albuterol (VENTOLIN HFA) 108 (90 Base) MCG/ACT inhaler Inhale 2 puffs into the lungs every 4 (four) hours as needed for wheezing or shortness of breath.   amLODipine (NORVASC) 5 MG tablet Take 5 mg by mouth daily.   cetirizine (ZYRTEC) 10 MG tablet Take 10 mg by mouth daily.   Cholecalciferol (VITAMIN D) 125 MCG (5000 UT) CAPS Take 5,000 Units by mouth every other day.   esomeprazole (NEXIUM) 20 MG capsule Take 20 mg by mouth every other day.   ibuprofen (ADVIL) 200 MG tablet Take 400-800 mg by mouth every 6 (six) hours as needed for moderate pain.   lisinopril (ZESTRIL) 40 MG tablet Take 40 mg by mouth daily.   metoprolol tartrate (LOPRESSOR) 100 MG tablet Take 1 tablet by mouth once for procedure.     Allergies:    Mucinex [guaifenesin er]   Social History: Social History   Socioeconomic History   Marital status: Married    Spouse  name: Not on file   Number of children: 3   Years of education: Not on file   Highest education level: Not on file  Occupational History   Occupation: Neurosurgeon  Tobacco Use   Smoking status: Former    Types: Cigarettes   Smokeless tobacco: Never  Vaping Use   Vaping Use: Never used  Substance and Sexual Activity   Alcohol use: No   Drug use: No   Sexual activity: Yes    Birth control/protection: Surgical  Other Topics Concern   Not on file  Social History Narrative   Not on file   Social Determinants of Health   Financial Resource Strain: Not on file  Food Insecurity: Not on file  Transportation Needs: Not on file  Physical Activity: Not on file  Stress: Not on file  Social Connections: Not on file     Family History: The  patient's family history includes Cancer in her brother, father, and maternal aunt; Diabetes in her father; Heart disease in her father and mother; Hypertension in her father, mother, sister, and son; Stroke in her son.  ROS:   All other ROS reviewed and negative. Pertinent positives noted in the HPI.     EKGs/Labs/Other Studies Reviewed:   The following studies were personally reviewed by me today:  EKG:  EKG is ordered today.  The ekg ordered today demonstrates normal sinus rhythm heart rate 84, no acute ischemic changes or evidence of infarction, and was personally reviewed by me.   Recent Labs: 11/18/2021: BUN 10; Creatinine, Ser 0.77; Hemoglobin 12.3; Platelets 262; Potassium 3.6; Sodium 139   Recent Lipid Panel No results found for: "CHOL", "TRIG", "HDL", "CHOLHDL", "VLDL", "LDLCALC", "LDLDIRECT"  Physical Exam:   VS:  BP 128/70   Pulse 84   Ht 5\' 3"  (1.6 m)   Wt 199 lb 3.2 oz (90.4 kg)   SpO2 99%   BMI 35.29 kg/m    Wt Readings from Last 3 Encounters:  02/03/22 199 lb 3.2 oz (90.4 kg)  11/18/21 198 lb (89.8 kg)  10/25/21 198 lb (89.8 kg)    General: Well nourished, well developed, in no acute distress Head: Atraumatic, normal size  Eyes: PEERLA, EOMI  Neck: Supple, no JVD Endocrine: No thryomegaly Cardiac: Normal S1, S2; RRR; no murmurs, rubs, or gallops Lungs: Clear to auscultation bilaterally, no wheezing, rhonchi or rales  Abd: Soft, nontender, no hepatomegaly  Ext: No edema, pulses 2+ Musculoskeletal: No deformities, BUE and BLE strength normal and equal Skin: Warm and dry, no rashes   Neuro: Alert and oriented to person, place, time, and situation, CNII-XII grossly intact, no focal deficits  Psych: Normal mood and affect   ASSESSMENT:   Donna Mckay is a 66 y.o. female who presents for the following: 1. Precordial pain   2. SOB (shortness of breath) on exertion     PLAN:   1. Precordial pain 2. SOB (shortness of breath) on exertion -Episodic chest  discomfort.  Possibly cardiac.  Main risk factors include hypertension and family history of heart disease.  Do not have results of her recent cholesterol profile.  EKG is nonischemic.  CV exam is normal.  Recent evaluation in the emergency room showed negative high-sensitivity troponins.  We will evaluate with coronary CTA.  She will give 71 a BMP and BNP today.  She will take 100 mg metoprolol tartrate before the study.  She will also obtain an echocardiogram.  Suspect her symptoms could be possibly cardiac.  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  She will see me back as needed based on the results of her scan.      Disposition: Return if symptoms worsen or fail to improve.  Medication Adjustments/Labs and Tests Ordered: Current medicines are reviewed at length with the patient today.  Concerns regarding medicines are outlined above.  Orders Placed This Encounter  Procedures   CT CORONARY MORPH W/CTA COR W/SCORE W/CA W/CM &/OR WO/CM   Basic metabolic panel   Brain natriuretic peptide   EKG 12-Lead   ECHOCARDIOGRAM COMPLETE   Meds ordered this encounter  Medications   metoprolol tartrate (LOPRESSOR) 100 MG tablet    Sig: Take 1 tablet by mouth once for procedure.    Dispense:  1 tablet    Refill:  0    Patient Instructions  Medication Instructions:  Take Metoprolol 100 mg two hours before CT when scheduled.   *If you need a refill on your cardiac medications before your next appointment, please call your pharmacy*   Lab Work: BMET, BNP today   If you have labs (blood work) drawn today and your tests are completely normal, you will receive your results only by: MyChart Message (if you have MyChart) OR A paper copy in the mail If you have any lab test that is abnormal or we need to change your treatment, we will call you to review the results.   Testing/Procedures: Your physician has requested that you have cardiac CT. Cardiac  computed tomography (CT) is a painless test that uses an x-ray machine to take clear, detailed pictures of your heart. For further information please visit [email or web address] Please follow instruction sheet as given.  Echocardiogram - Your physician has requested that you have an echocardiogram. Echocardiography is a painless test that uses sound waves to create images of your heart. It provides your doctor with information about the size and shape of your heart and how well your heart's chambers and valves are working. This procedure takes approximately one hour. There are no restrictions for this procedure.     Follow-Up: At [facility], you and your health needs are our priority.  As part of our continuing mission to provide you with exceptional heart care, we have created designated Provider Care Teams.  These Care Teams include your primary Cardiologist (physician) and Advanced Practice Providers (APPs -  Physician Assistants and Nurse Practitioners) who all work together to provide you with the care you need, when you need it.  We recommend signing up for the patient portal called "MyChart".  Sign up information is provided on this After Visit Summary.  MyChart is used to connect with patients for Virtual Visits (Telemedicine).  Patients are able to view lab/test results, encounter notes, upcoming appointments, etc.  Non-urgent messages can be sent to your provider as well.   To learn more about what you can do with MyChart, go to [email or web address].    Your next appointment:   As needed  The format for your next appointment:   In Person  Provider:   Lennie Odor, MD     Other Instructions   Your cardiac CT will be scheduled at one of the below locations:   [facility] [address] [place], [place] [address] [phone]  If scheduled at [facility], please arrive at the [facility]'s Entrance (Entrance C2) of [facility] 30  minutes prior to test start time. You can use the FREE valet  parking offered at entrance C (encouraged to control the heart rate for the test)  Proceed to the [facility] Radiology Department (first floor) to check-in and test prep.  All radiology patients and guests should use entrance C2 at [facility], accessed from [facility], even though the hospital's physical address listed is [address].     Please follow these instructions carefully (unless otherwise directed):   On the Night Before the Test: Be sure to Drink plenty of water. Do not consume any caffeinated/decaffeinated beverages or chocolate 12 hours prior to your test. Do not take any antihistamines 12 hours prior to your test.  On the Day of the Test: Drink plenty of water until 1 hour prior to the test. Do not eat any food 4 hours prior to the test. You may take your regular medications prior to the test.  Take metoprolol (Lopressor) two hours prior to test. HOLD Furosemide/Hydrochlorothiazide morning of the test. FEMALES- please wear underwire-free bra if available, avoid dresses & tight clothing      After the Test: Drink plenty of water. After receiving IV contrast, you may experience a mild flushed feeling. This is normal. On occasion, you may experience a mild rash up to 24 hours after the test. This is not dangerous. If this occurs, you can take Benadryl 25 mg and increase your fluid intake. If you experience trouble breathing, this can be serious. If it is severe call 911 IMMEDIATELY. If it is mild, please call our office. If you take any of these medications: Glipizide/Metformin, Avandament, Glucavance, please do not take 48 hours after completing test unless otherwise instructed.  We will call to schedule your test 2-4 weeks out understanding that some insurance companies will need an authorization prior to the service being performed.   For non-scheduling related questions,  please contact the cardiac imaging nurse navigator should you have any questions/concerns: Rockwell Alexandria, Cardiac Imaging Nurse Navigator Larey Brick, Cardiac Imaging Nurse Navigator [facility] Direct Office Dial: [phone]   For scheduling needs, including cancellations and rescheduling, please call [place], [phone].            Signed, Lenna Gilford. Flora Lipps, MD, [facility]  [facility]  [address] [place], [place] [address] [phone]  02/03/2022 8:44 AM

## 2022-02-03 ENCOUNTER — Encounter: Payer: Self-pay | Admitting: Cardiovascular Disease

## 2022-02-03 ENCOUNTER — Ambulatory Visit: Payer: PPO | Admitting: Cardiovascular Disease

## 2022-02-03 VITALS — BP 128/70 | HR 84 | Ht 63.0 in | Wt 199.2 lb

## 2022-02-03 DIAGNOSIS — R072 Precordial pain: Secondary | ICD-10-CM

## 2022-02-03 DIAGNOSIS — R0602 Shortness of breath: Secondary | ICD-10-CM

## 2022-02-03 MED ORDER — METOPROLOL TARTRATE 100 MG PO TABS: ORAL_TABLET | ORAL | 0 refills | Status: DC

## 2022-02-03 NOTE — Patient Instructions (Signed)
Medication Instructions:  Take Metoprolol 100 mg two hours before CT when scheduled.   *If you need a refill on your cardiac medications before your next appointment, please call your pharmacy*   Lab Work: BMET, BNP today   If you have labs (blood work) drawn today and your tests are completely normal, you will receive your results only by: MyChart Message (if you have MyChart) OR A paper copy in the mail If you have any lab test that is abnormal or we need to change your treatment, we will call you to review the results.   Testing/Procedures: Your physician has requested that you have cardiac CT. Cardiac computed tomography (CT) is a painless test that uses an x-ray machine to take clear, detailed pictures of your heart. For further information please visit [email or web address] Please follow instruction sheet as given.  Echocardiogram - Your physician has requested that you have an echocardiogram. Echocardiography is a painless test that uses sound waves to create images of your heart. It provides your doctor with information about the size and shape of your heart and how well your heart's chambers and valves are working. This procedure takes approximately one hour. There are no restrictions for this procedure.     Follow-Up: At [facility], you and your health needs are our priority.  As part of our continuing mission to provide you with exceptional heart care, we have created designated Provider Care Teams.  These Care Teams include your primary Cardiologist (physician) and Advanced Practice Providers (APPs -  Physician Assistants and Nurse Practitioners) who all work together to provide you with the care you need, when you need it.  We recommend signing up for the patient portal called "MyChart".  Sign up information is provided on this After Visit Summary.  MyChart is used to connect with patients for Virtual Visits (Telemedicine).  Patients are able to view lab/test results,  encounter notes, upcoming appointments, etc.  Non-urgent messages can be sent to your provider as well.   To learn more about what you can do with MyChart, go to [email or web address].    Your next appointment:   As needed  The format for your next appointment:   In Person  Provider:   Lennie Odor, MD     Other Instructions   Your cardiac CT will be scheduled at one of the below locations:   [facility] [address] [place], [place] [address] [phone]  If scheduled at [facility], please arrive at the [facility] Entrance (Entrance C2) of [facility] 30 minutes prior to test start time. You can use the FREE valet parking offered at entrance C (encouraged to control the heart rate for the test)  Proceed to the [facility] (first floor) to check-in and test prep.  All radiology patients and guests should use entrance C2 at [facility], accessed from [facility], even though the hospital's physical address listed is [address]     Please follow these instructions carefully (unless otherwise directed):   On the Night Before the Test: Be sure to Drink plenty of water. Do not consume any caffeinated/decaffeinated beverages or chocolate 12 hours prior to your test. Do not take any antihistamines 12 hours prior to your test.  On the Day of the Test: Drink plenty of water until 1 hour prior to the test. Do not eat any food 4 hours prior to the test. You may take your regular medications prior  to the test.  Take metoprolol (Lopressor) two hours prior to test. HOLD Furosemide/Hydrochlorothiazide morning of the test. FEMALES- please wear underwire-free bra if available, avoid dresses & tight clothing      After the Test: Drink plenty of water. After receiving IV contrast, you may experience a mild flushed feeling. This is normal. On occasion, you may experience a mild rash  up to 24 hours after the test. This is not dangerous. If this occurs, you can take Benadryl 25 mg and increase your fluid intake. If you experience trouble breathing, this can be serious. If it is severe call 911 IMMEDIATELY. If it is mild, please call our office. If you take any of these medications: Glipizide/Metformin, Avandament, Glucavance, please do not take 48 hours after completing test unless otherwise instructed.  We will call to schedule your test 2-4 weeks out understanding that some insurance companies will need an authorization prior to the service being performed.   For non-scheduling related questions, please contact the cardiac imaging nurse navigator should you have any questions/concerns: Rockwell Alexandria, Cardiac Imaging Nurse Navigator Larey Brick, Cardiac Imaging Nurse Navigator [facility] Direct Office Dial: [phone]   For scheduling needs, including cancellations and rescheduling, please call [place], [phone].

## 2022-02-04 LAB — BRAIN NATRIURETIC PEPTIDE: BNP: 5.7 pg/mL (ref 0.0–100.0)

## 2022-02-04 LAB — BASIC METABOLIC PANEL
BUN/Creatinine Ratio: 14 (ref 12–28)
BUN: 9 mg/dL (ref 8–27)
CO2: 23 mmol/L (ref 20–29)
Calcium: 9.7 mg/dL (ref 8.7–10.3)
Chloride: 101 mmol/L (ref 96–106)
Creatinine, Ser: 0.65 mg/dL (ref 0.57–1.00)
Glucose: 107 mg/dL — ABNORMAL HIGH (ref 70–99)
Potassium: 5.2 mmol/L (ref 3.5–5.2)
Sodium: 137 mmol/L (ref 134–144)
eGFR: 97 mL/min/{1.73_m2} (ref >59)

## 2022-02-07 ENCOUNTER — Encounter: Payer: Self-pay | Admitting: Obstetrics & Gynecology

## 2022-02-07 ENCOUNTER — Other Ambulatory Visit (HOSPITAL_COMMUNITY)
Admission: RE | Admit: 2022-02-07 | Discharge: 2022-02-07 | Disposition: A | Discharge status: Home / Self Care | Payer: PPO | Source: Ambulatory Visit | Attending: Obstetrics & Gynecology | Admitting: Obstetrics & Gynecology

## 2022-02-07 ENCOUNTER — Ambulatory Visit (INDEPENDENT_AMBULATORY_CARE_PROVIDER_SITE_OTHER): Payer: PPO | Admitting: Obstetrics & Gynecology

## 2022-02-07 VITALS — BP 127/72 | HR 70 | Ht 63.0 in | Wt 201.5 lb

## 2022-02-07 DIAGNOSIS — Z01419 Encounter for gynecological examination (general) (routine) without abnormal findings: Secondary | ICD-10-CM | POA: Insufficient documentation

## 2022-02-07 DIAGNOSIS — Z1212 Encounter for screening for malignant neoplasm of rectum: Secondary | ICD-10-CM | POA: Diagnosis not present

## 2022-02-07 DIAGNOSIS — Z1151 Encounter for screening for human papillomavirus (HPV): Secondary | ICD-10-CM | POA: Diagnosis not present

## 2022-02-07 DIAGNOSIS — Z1211 Encounter for screening for malignant neoplasm of colon: Secondary | ICD-10-CM | POA: Diagnosis not present

## 2022-02-07 LAB — HEMOCCULT GUIAC POC 1CARD (OFFICE): Fecal Occult Blood, POC: NEGATIVE

## 2022-02-07 NOTE — Progress Notes (Signed)
Subjective:     Donna Mckay is a 66 y.o. female here for a routine exam.  No LMP recorded. Patient is postmenopausal. [id number] Birth Control Method:  menopausal Menstrual Calendar(currently): amenorrhea  Current complaints: recent chest discomfort, being worked up.   Current acute medical issues:     Recent Gynecologic History No LMP recorded. Patient is postmenopausal. Last Pap: 2020,  normal Last mammogram: 1/23,  normal  Past Medical History:  Diagnosis Date   Allergy    Hypertension    PONV (postoperative nausea and vomiting)     Past Surgical History:  Procedure Laterality Date   COLONOSCOPY WITH PROPOFOL N/A 10/25/2021   Procedure: COLONOSCOPY WITH PROPOFOL;  Surgeon: Corbin Ade, MD;  Location: [facility];  Service: Endoscopy;  Laterality: N/A;  7:30 / ASA 2   ENDOMETRIAL ABLATION     TUBAL LIGATION      OB History     Gravida  4   Para  3   Term      Preterm      AB  1   Living  3      SAB  1   IAB      Ectopic      Multiple      Live Births              Social History   Socioeconomic History   Marital status: Married    Spouse name: Not on file   Number of children: 3   Years of education: Not on file   Highest education level: Not on file  Occupational History   Occupation: Neurosurgeon  Tobacco Use   Smoking status: Former    Types: Cigarettes   Smokeless tobacco: Never  Vaping Use   Vaping Use: Never used  Substance and Sexual Activity   Alcohol use: No   Drug use: No   Sexual activity: Yes    Birth control/protection: Surgical    Comment: tubal  Other Topics Concern   Not on file  Social History Narrative   Not on file   Social Determinants of Health   Financial Resource Strain: Low Risk  (02/07/2022)   Overall Financial Resource Strain (CARDIA)    Difficulty of Paying Living Expenses: Not hard at all  Food Insecurity: No Food Insecurity (02/07/2022)   Hunger Vital Sign    Worried About Running Out of Food in  the Last Year: Never true    Ran Out of Food in the Last Year: Never true  Transportation Needs: No Transportation Needs (02/07/2022)   PRAPARE - Administrator, Civil Service (Medical): No    Lack of Transportation (Non-Medical): No  Physical Activity: Inactive (02/07/2022)   Exercise Vital Sign    Days of Exercise per Week: 0 days    Minutes of Exercise per Session: 0 min  Stress: No Stress Concern Present (02/07/2022)   [employer] of Occupational Health - Occupational Stress Questionnaire    Feeling of Stress : Not at all  Social Connections: Socially Integrated (02/07/2022)   Social Connection and Isolation Panel [NHANES]    Frequency of Communication with Friends and Family: More than three times a week    Frequency of Social Gatherings with Friends and Family: More than three times a week    Attends Religious Services: More than 4 times per year    Active Member of [employer] or Organizations: Yes    Attends Banker Meetings: More than 4 times  per year    Marital Status: Married    Family History  Problem Relation Age of Onset   Heart disease Mother    Hypertension Mother    Hypertension Father    Diabetes Father    Heart disease Father    Cancer Father        prostate   Cancer Brother    Cancer Maternal Aunt        breast   Hypertension Sister    Hypertension Son    Stroke Son      Current Outpatient Medications:    amLODipine (NORVASC) 5 MG tablet, Take 5 mg by mouth daily., Disp: , Rfl:    cetirizine (ZYRTEC) 10 MG tablet, Take 10 mg by mouth daily., Disp: , Rfl:    Cholecalciferol (VITAMIN D) 125 MCG (5000 UT) CAPS, Take 5,000 Units by mouth every other day., Disp: , Rfl:    esomeprazole (NEXIUM) 20 MG capsule, Take 20 mg by mouth every other day., Disp: , Rfl:    ibuprofen (ADVIL) 200 MG tablet, Take 400-800 mg by mouth every 6 (six) hours as needed for moderate pain., Disp: , Rfl:    lisinopril (ZESTRIL) 40 MG tablet, Take 40 mg by mouth  daily., Disp: , Rfl:    albuterol (VENTOLIN HFA) 108 (90 Base) MCG/ACT inhaler, Inhale 2 puffs into the lungs every 4 (four) hours as needed for wheezing or shortness of breath. (Patient not taking: Reported on 02/07/2022), Disp: , Rfl:    metoprolol tartrate (LOPRESSOR) 100 MG tablet, Take 1 tablet by mouth once for procedure. (Patient not taking: Reported on 02/07/2022), Disp: 1 tablet, Rfl: 0  Review of Systems  Review of Systems  Constitutional: Negative for fever, chills, weight loss, malaise/fatigue and diaphoresis.  HENT: Negative for hearing loss, ear pain, nosebleeds, congestion, sore throat, neck pain, tinnitus and ear discharge.   Eyes: Negative for blurred vision, double vision, photophobia, pain, discharge and redness.  Respiratory: Negative for cough, hemoptysis, sputum production, shortness of breath, wheezing and stridor.   Cardiovascular: Negative for chest pain, palpitations, orthopnea, claudication, leg swelling and PND.  Gastrointestinal: negative for abdominal pain. Negative for heartburn, nausea, vomiting, diarrhea, constipation, blood in stool and melena.  Genitourinary: Negative for dysuria, urgency, frequency, hematuria and flank pain.  Musculoskeletal: Negative for myalgias, back pain, joint pain and falls.  Skin: Negative for itching and rash.  Neurological: Negative for dizziness, tingling, tremors, sensory change, speech change, focal weakness, seizures, loss of consciousness, weakness and headaches.  Endo/Heme/Allergies: Negative for environmental allergies and polydipsia. Does not bruise/bleed easily.  Psychiatric/Behavioral: Negative for depression, suicidal ideas, hallucinations, memory loss and substance abuse. The patient is not nervous/anxious and does not have insomnia.        Objective:  Blood pressure 127/72, pulse 70, height 5\' 3"  (1.6 m), weight 201 lb 8 oz (91.4 kg).   Physical Exam  Vitals reviewed. Constitutional: She is oriented to person, place, and  time. She appears well-developed and well-nourished.  HENT:  Head: Normocephalic and atraumatic.        Right Ear: External ear normal.  Left Ear: External ear normal.  Nose: Nose normal.  Mouth/Throat: Oropharynx is clear and moist.  Eyes: Conjunctivae and EOM are normal. Pupils are equal, round, and reactive to light. Right eye exhibits no discharge. Left eye exhibits no discharge. No scleral icterus.  Neck: Normal range of motion. Neck supple. No tracheal deviation present. No thyromegaly present.  Cardiovascular: Normal rate, regular rhythm, normal heart sounds and  intact distal pulses.  Exam reveals no gallop and no friction rub.   No murmur heard. Respiratory: Effort normal and breath sounds normal. No respiratory distress. She has no wheezes. She has no rales. She exhibits no tenderness.  GI: Soft. Bowel sounds are normal. She exhibits no distension and no mass. There is no tenderness. There is no rebound and no guarding.  Genitourinary:  Breasts no masses skin changes or nipple changes bilaterally      Vulva is normal without lesions Vagina is pink moist without discharge Cervix normal in appearance and pap is done Uterus is normal size shape and contour Adnexa is negative with normal sized ovaries  {Rectal    hemoccult negative, normal tone, no masses  Musculoskeletal: Normal range of motion. She exhibits no edema and no tenderness.  Neurological: She is alert and oriented to person, place, and time. She has normal reflexes. She displays normal reflexes. No cranial nerve deficit. She exhibits normal muscle tone. Coordination normal.  Skin: Skin is warm and dry. No rash noted. No erythema. No pallor.  Psychiatric: She has a normal mood and affect. Her behavior is normal. Judgment and thought content normal.       Medications Ordered at today's visit: No orders of the defined types were placed in this encounter.   Other orders placed at today's visit: No orders of the defined  types were placed in this encounter.     Assessment:    Normal Gyn exam.    Plan:    Contraception: post menopausal status. Follow up in: 3 years. Normal gyn and general exam      Return in about 3 years (around 02/07/2025) for yearly.

## 2022-02-07 NOTE — Addendum Note (Signed)
Addended by: Moss Mc on: 02/07/2022 09:26 AM   Modules accepted: Orders

## 2022-02-11 ENCOUNTER — Ambulatory Visit (HOSPITAL_COMMUNITY)
Admission: RE | Admit: 2022-02-11 | Discharge: 2022-02-11 | Disposition: A | Discharge status: Home / Self Care | Payer: PPO | Source: Ambulatory Visit | Attending: Cardiovascular Disease | Admitting: Cardiovascular Disease

## 2022-02-11 DIAGNOSIS — R072 Precordial pain: Secondary | ICD-10-CM | POA: Insufficient documentation

## 2022-02-11 DIAGNOSIS — R0602 Shortness of breath: Secondary | ICD-10-CM | POA: Diagnosis not present

## 2022-02-11 LAB — CYTOLOGY - PAP
Adequacy: ABSENT
Comment: NEGATIVE
Diagnosis: NEGATIVE
High risk HPV: NEGATIVE

## 2022-02-11 LAB — ECHOCARDIOGRAM COMPLETE
Area-P 1/2: 3.42 cm2
S' Lateral: 1.8 cm

## 2022-02-11 NOTE — Progress Notes (Signed)
*  PRELIMINARY RESULTS* Echocardiogram 2D Echocardiogram has been performed.  Stacey Drain 02/11/2022, 9:03 AM

## 2022-02-25 ENCOUNTER — Telehealth (HOSPITAL_COMMUNITY): Payer: Self-pay | Admitting: *Deleted

## 2022-02-25 ENCOUNTER — Ambulatory Visit: Payer: PPO | Admitting: Cardiology

## 2022-02-25 NOTE — Telephone Encounter (Signed)
Attempted to call patient regarding upcoming cardiac CT appointment. °Left message on voicemail with name and callback number ° °Ronnita Paz RN Navigator Cardiac Imaging °[facility] °[phone] Office °[phone] Cell ° °

## 2022-02-26 ENCOUNTER — Ambulatory Visit (HOSPITAL_BASED_OUTPATIENT_CLINIC_OR_DEPARTMENT_OTHER)
Admission: RE | Admit: 2022-02-26 | Discharge: 2022-02-26 | Disposition: A | Discharge status: Home / Self Care | Payer: PPO | Source: Ambulatory Visit | Attending: Cardiology | Admitting: Cardiology

## 2022-02-26 ENCOUNTER — Ambulatory Visit (HOSPITAL_COMMUNITY)
Admission: RE | Admit: 2022-02-26 | Discharge: 2022-02-26 | Disposition: A | Discharge status: Home / Self Care | Payer: PPO | Source: Ambulatory Visit | Attending: Cardiovascular Disease | Admitting: Cardiovascular Disease

## 2022-02-26 ENCOUNTER — Other Ambulatory Visit: Payer: Self-pay | Admitting: Cardiology

## 2022-02-26 DIAGNOSIS — R072 Precordial pain: Secondary | ICD-10-CM | POA: Diagnosis not present

## 2022-02-26 DIAGNOSIS — R931 Abnormal findings on diagnostic imaging of heart and coronary circulation: Secondary | ICD-10-CM | POA: Insufficient documentation

## 2022-02-26 DIAGNOSIS — I2510 Atherosclerotic heart disease of native coronary artery without angina pectoris: Secondary | ICD-10-CM | POA: Diagnosis not present

## 2022-02-26 MED ORDER — NITROGLYCERIN 0.4 MG SL SUBL
0.8000 mg | SUBLINGUAL_TABLET | Freq: Once | SUBLINGUAL | Status: AC
  Administered 2022-02-26: 0.8 mg via SUBLINGUAL

## 2022-02-26 MED ORDER — NITROGLYCERIN 0.4 MG SL SUBL
SUBLINGUAL_TABLET | SUBLINGUAL | Status: DC
Start: 2022-02-26 — End: 2022-02-26
  Filled 2022-02-26: qty 2

## 2022-02-26 MED ORDER — IOHEXOL 350 MG/ML SOLN
100.0000 mL | Freq: Once | INTRAVENOUS | Status: AC | PRN
  Administered 2022-02-26: 100 mL via INTRAVENOUS

## 2022-02-28 ENCOUNTER — Other Ambulatory Visit: Payer: Self-pay

## 2022-02-28 ENCOUNTER — Encounter: Payer: Self-pay | Admitting: Cardiovascular Disease

## 2022-02-28 MED ORDER — ASPIRIN 81 MG PO TBEC: 81.0000 mg | DELAYED_RELEASE_TABLET | Freq: Every day | ORAL | 3 refills | Status: AC

## 2022-02-28 MED ORDER — NITROGLYCERIN 0.4 MG SL SUBL: 0.4000 mg | SUBLINGUAL_TABLET | SUBLINGUAL | 3 refills | Status: AC | PRN

## 2022-02-28 MED ORDER — METOPROLOL TARTRATE 25 MG PO TABS: 25.0000 mg | ORAL_TABLET | Freq: Two times a day (BID) | ORAL | 3 refills | Status: DC

## 2022-03-03 NOTE — H&P (View-Only) (Signed)
Cardiology Office Note:   Date:  03/04/2022  NAME:  Donna Mckay    MRN: [id number] DOB:  11-05-1955   PCP:  Carylon Perches, MD  Cardiologist:  None  Electrophysiologist:  None   Referring MD: Carylon Perches, MD   Chief Complaint  Patient presents with   Follow-up         History of Present Illness:   Donna Mckay is a 66 y.o. female with a hx of CAD who presents for follow-up.  She presents with her husband.  Still having back pain.  Occurs with exertion.  Also shortness of breath.  Symptoms occur with exertion.  She is having symptoms at night as well.  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  We discussed given the location in the proximal to mid LAD I would recommend revascularization.  She is amendable to this.  Cholesterol not that bad but she will start Lipitor 40 mg daily.  We discussed risk and benefits of the procedure.  She is willing to proceed.  EKG shows normal sinus rhythm with no acute ischemic changes.  Echo normal.  Problem List CAD -CAC score 113 (81st precentile) -70-99% mid LAD CT FFR 0.61 2. HTN 3. HLD -T chol 219, HDL 72, triglycerides 92, LDL 131  Past Medical History: Past Medical History:  Diagnosis Date   Allergy    Hypertension    PONV (postoperative nausea and vomiting)     Past Surgical History: Past Surgical History:  Procedure Laterality Date   COLONOSCOPY WITH PROPOFOL N/A 10/25/2021   Procedure: COLONOSCOPY WITH PROPOFOL;  Surgeon: Corbin Ade, MD;  Location: [facility];  Service: Endoscopy;  Laterality: N/A;  7:30 / ASA 2   ENDOMETRIAL ABLATION     TUBAL LIGATION      Current Medications: Current Meds  Medication Sig   albuterol (VENTOLIN HFA) 108 (90 Base) MCG/ACT inhaler Inhale 2 puffs into the lungs every 4 (four) hours as needed for wheezing or shortness of breath.   amLODipine (NORVASC) 5 MG tablet Take 5 mg by mouth daily.   aspirin EC 81 MG tablet Take 1 tablet (81 mg total) by mouth daily. Swallow  whole.   atorvastatin (LIPITOR) 40 MG tablet Take 1 tablet (40 mg total) by mouth daily.   cetirizine (ZYRTEC) 10 MG tablet Take 10 mg by mouth daily.   Cholecalciferol (VITAMIN D) 125 MCG (5000 UT) CAPS Take 5,000 Units by mouth every other day.   esomeprazole (NEXIUM) 20 MG capsule Take 20 mg by mouth every other day.   ibuprofen (ADVIL) 200 MG tablet Take 400-800 mg by mouth every 6 (six) hours as needed for moderate pain.   lisinopril (ZESTRIL) 40 MG tablet Take 40 mg by mouth daily.   metoprolol tartrate (LOPRESSOR) 25 MG tablet Take 1 tablet (25 mg total) by mouth 2 (two) times daily.   nitroGLYCERIN (NITROSTAT) 0.4 MG SL tablet Place 1 tablet (0.4 mg total) under the tongue every 5 (five) minutes as needed for chest pain.     Allergies:    Mucinex [guaifenesin er]   Social History: Social History   Socioeconomic History   Marital status: Married    Spouse name: Not on file   Number of children: 3   Years of education: Not on file   Highest education level: Not on file  Occupational History   Occupation: Neurosurgeon  Tobacco Use   Smoking status: Former    Types: Cigarettes  Smokeless tobacco: Never  Vaping Use   Vaping Use: Never used  Substance and Sexual Activity   Alcohol use: No   Drug use: No   Sexual activity: Yes    Birth control/protection: Surgical    Comment: tubal  Other Topics Concern   Not on file  Social History Narrative   Not on file   Social Determinants of Health   Financial Resource Strain: Low Risk  (02/07/2022)   Overall Financial Resource Strain (CARDIA)    Difficulty of Paying Living Expenses: Not hard at all  Food Insecurity: No Food Insecurity (02/07/2022)   Hunger Vital Sign    Worried About Running Out of Food in the Last Year: Never true    Ran Out of Food in the Last Year: Never true  Transportation Needs: No Transportation Needs (02/07/2022)   PRAPARE - Hydrologist (Medical): No    Lack of Transportation  (Non-Medical): No  Physical Activity: Inactive (02/07/2022)   Exercise Vital Sign    Days of Exercise per Week: 0 days    Minutes of Exercise per Session: 0 min  Stress: No Stress Concern Present (02/07/2022)   [place]    Feeling of Stress : Not at all  Social Connections: [place] (02/07/2022)   Social Connection and Isolation Panel [NHANES]    Frequency of Communication with Friends and Family: More than three times a week    Frequency of Social Gatherings with Friends and Family: More than three times a week    Attends Religious Services: More than 4 times per year    Active Member of [employer] or Organizations: Yes    Attends Music therapist: More than 4 times per year    Marital Status: Married     Family History: The patient's family history includes Cancer in her brother, father, and maternal aunt; Diabetes in her father; Heart disease in her father and mother; Hypertension in her father, mother, sister, and son; Stroke in her son.  ROS:   All other ROS reviewed and negative. Pertinent positives noted in the HPI.     EKGs/Labs/Other Studies Reviewed:   The following studies were personally reviewed by me today:  EKG:  EKG is ordered today.  The ekg ordered today demonstrates normal sinus rhythm heart rate 72, no acute ischemic changes, and was personally reviewed by me.   TTE 02/11/2022  1. Left ventricular ejection fraction, by estimation, is >75%. The left  ventricle has hyperdynamic function. The left ventricle has no regional  wall motion abnormalities. Left ventricular diastolic parameters are  indeterminate.   2. Right ventricular systolic function is normal. The right ventricular  size is normal. There is normal pulmonary artery systolic pressure.   3. The mitral valve is normal in structure. Mild mitral valve  regurgitation.   4. The aortic valve is normal in structure. Aortic valve  regurgitation is  not visualized.   Recent Labs: 11/18/2021: Hemoglobin 12.3; Platelets 262 02/03/2022: BNP 5.7; BUN 9; Creatinine, Ser 0.65; Potassium 5.2; Sodium 137   Recent Lipid Panel No results found for: "CHOL", "TRIG", "HDL", "CHOLHDL", "VLDL", "LDLCALC", "LDLDIRECT"  Physical Exam:   VS:  BP 124/72   Pulse 72   Ht 5\' 3"  (1.6 m)   Wt 200 lb 9.6 oz (91 kg)   SpO2 99%   BMI 35.53 kg/m    Wt Readings from Last 3 Encounters:  03/04/22 200 lb 9.6 oz (  91 kg)  02/07/22 201 lb 8 oz (91.4 kg)  02/03/22 199 lb 3.2 oz (90.4 kg)    General: Well nourished, well developed, in no acute distress Head: Atraumatic, normal size  Eyes: PEERLA, EOMI  Neck: Supple, no JVD Endocrine: No thryomegaly Cardiac: Normal S1, S2; RRR; no murmurs, rubs, or gallops Lungs: Clear to auscultation bilaterally, no wheezing, rhonchi or rales  Abd: Soft, nontender, no hepatomegaly  Ext: No edema, pulses 2+ Musculoskeletal: No deformities, BUE and BLE strength normal and equal Skin: Warm and dry, no rashes   Neuro: Alert and oriented to person, place, time, and situation, CNII-XII grossly intact, no focal deficits  Psych: Normal mood and affect   ASSESSMENT:   Donna Mckay is a 66 y.o. female who presents for the following: 1. Coronary artery disease involving native coronary artery of native heart with unstable angina pectoris (HCC)   2. Mixed hyperlipidemia   3. Primary hypertension     PLAN:   1. Coronary artery disease involving native coronary artery of native heart with unstable angina pectoris (HCC) 2. Mixed hyperlipidemia -Found to have significant stenosis in the proximal to mid LAD.  Continues to have symptoms of chest pain or shortness of breath despite amlodipine and metoprolol.  Symptoms are occurring at rest as well.  I believe she is developing unstable angina.  Given the tight stenosis in the proximal to mid LAD I recommended left heart catheterization with likely percutaneous coronary  intervention.  She is willing to proceed.  She will continue aspirin 81 mg daily.  We will start Lipitor 40 mg daily.  She is on metoprolol tartrate 25 mg twice daily as well as amlodipine 5 mg daily.  No further room for medical therapy.  She will see me back in 3 to 4 weeks after the heart catheterization.  3. Primary hypertension -Well-controlled on current medication.   Shared Decision Making/Informed Consent The risks [stroke (1 in 1000), death (1 in 1000), kidney failure [usually temporary] (1 in 500), bleeding (1 in 200), allergic reaction [possibly serious] (1 in 200)], benefits (diagnostic support and management of coronary artery disease) and alternatives of a cardiac catheterization were discussed in detail with Ms. Krukowski and she is willing to proceed.  Disposition: Return in about 1 month (around 04/04/2022).  Medication Adjustments/Labs and Tests Ordered: Current medicines are reviewed at length with the patient today.  Concerns regarding medicines are outlined above.  Orders Placed This Encounter  Procedures   Basic metabolic panel   CBC   EKG 12-Lead   Meds ordered this encounter  Medications   atorvastatin (LIPITOR) 40 MG tablet    Sig: Take 1 tablet (40 mg total) by mouth daily.    Dispense:  90 tablet    Refill:  3    Patient Instructions  Medication Instructions:  START Lipitor 40 mg daily   *If you need a refill on your cardiac medications before your next appointment, please call your pharmacy*   Lab Work: CBC, BMET TODAY   If you have labs (blood work) drawn today and your tests are completely normal, you will receive your results only by: MyChart Message (if you have MyChart) OR A paper copy in the mail If you have any lab test that is abnormal or we need to change your treatment, we will call you to review the results.   Testing/Procedures:  Your physician has requested that you have a cardiac catheterization. Cardiac catheterization is used to  diagnose and/or treat various  heart conditions. Doctors may recommend this procedure for a number of different reasons. The most common reason is to evaluate chest pain. Chest pain can be a symptom of coronary artery disease (CAD), and cardiac catheterization can show whether plaque is narrowing or blocking your heart's arteries. This procedure is also used to evaluate the valves, as well as measure the blood flow and oxygen levels in different parts of your heart. For further information please visit [email or web address] Please follow instruction sheet, as given.    Follow-Up: At [facility], you and your health needs are our priority.  As part of our continuing mission to provide you with exceptional heart care, we have created designated Provider Care Teams.  These Care Teams include your primary Cardiologist (physician) and Advanced Practice Providers (APPs -  Physician Assistants and Nurse Practitioners) who all work together to provide you with the care you need, when you need it.  We recommend signing up for the patient portal called "MyChart".  Sign up information is provided on this After Visit Summary.  MyChart is used to connect with patients for Virtual Visits (Telemedicine).  Patients are able to view lab/test results, encounter notes, upcoming appointments, etc.  Non-urgent messages can be sent to your provider as well.   To learn more about what you can do with MyChart, go to [email or web address].    Your next appointment:   August 24th at 1:20 PM  The format for your next appointment:   In Person  Provider:   Lennie Odor, MD    Other Instructions  [facility] CARDIOVASCULAR DIVISION [facility] [address] [place] [address] [place] [address] Dept: [phone] Loc: [phone]  Donna Mckay  03/04/2022  You are scheduled for a Cardiac Catheterization on August 8th with Dr.Smith  1. Please arrive at the Main Entrance A at  [facility]: [address] [place], [place] [address] at 5:30 AM (This time is two hours before your procedure to ensure your preparation). Free valet parking service is available.   Special note: Every effort is made to have your procedure done on time. Please understand that emergencies sometimes delay scheduled procedures.  2. Diet: Do not eat solid foods after midnight.  You may have clear liquids until 5 AM upon the day of the procedure.  3. Labs: You will need to have blood drawn today- CBC, BMET. You do not need to be fasting.  4. Medication instructions in preparation for your procedure:   Contrast Allergy: No  On the morning of your procedure, take Aspirin and any morning medicines NOT listed above.  You may use sips of water.  5. Plan to go home the same day, you will only stay overnight if medically necessary. 6. You MUST have a responsible adult to drive you home. 7. An adult MUST be with you the first 24 hours after you arrive home. 8. Bring a current list of your medications, and the last time and date medication taken. 9. Bring ID and current insurance cards. 10.Please wear clothes that are easy to get on and off and wear slip-on shoes.  Thank you for allowing [place] to care for you!   -- [facility]           Time Spent with Patient: I have spent a total of 35 minutes with patient reviewing hospital notes, telemetry, EKGs, labs and examining the patient as well as establishing an assessment and plan that was discussed with  the patient.  > 50% of time was spent in direct patient care.  Signed, Lenna Gilford. Flora Lipps, MD, [facility]  [facility]  [address] [place], [place] [address] [phone]  03/04/2022 3:19 PM

## 2022-03-03 NOTE — Progress Notes (Unsigned)
Cardiology Office Note:   Date:  03/04/2022  NAME:  Donna Mckay    MRN: [id number] DOB:  11-05-1955   PCP:  Carylon Perches, MD  Cardiologist:  None  Electrophysiologist:  None   Referring MD: Carylon Perches, MD   Chief Complaint  Patient presents with   Follow-up         History of Present Illness:   Donna Mckay is a 66 y.o. female with a hx of CAD who presents for follow-up.  She presents with her husband.  Still having back pain.  Occurs with exertion.  Also shortness of breath.  Symptoms occur with exertion.  She is having symptoms at night as well.  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  We discussed given the location in the proximal to mid LAD I would recommend revascularization.  She is amendable to this.  Cholesterol not that bad but she will start Lipitor 40 mg daily.  We discussed risk and benefits of the procedure.  She is willing to proceed.  EKG shows normal sinus rhythm with no acute ischemic changes.  Echo normal.  Problem List CAD -CAC score 113 (81st precentile) -70-99% mid LAD CT FFR 0.61 2. HTN 3. HLD -T chol 219, HDL 72, triglycerides 92, LDL 131  Past Medical History: Past Medical History:  Diagnosis Date   Allergy    Hypertension    PONV (postoperative nausea and vomiting)     Past Surgical History: Past Surgical History:  Procedure Laterality Date   COLONOSCOPY WITH PROPOFOL N/A 10/25/2021   Procedure: COLONOSCOPY WITH PROPOFOL;  Surgeon: Corbin Ade, MD;  [facility];  Service: Endoscopy;  Laterality: N/A;  7:30 / ASA 2   ENDOMETRIAL ABLATION     TUBAL LIGATION      Current Medications: Current Meds  Medication Sig   albuterol (VENTOLIN HFA) 108 (90 Base) MCG/ACT inhaler Inhale 2 puffs into the lungs every 4 (four) hours as needed for wheezing or shortness of breath.   amLODipine (NORVASC) 5 MG tablet Take 5 mg by mouth daily.   aspirin EC 81 MG tablet Take 1 tablet (81 mg total) by mouth daily. Swallow  whole.   atorvastatin (LIPITOR) 40 MG tablet Take 1 tablet (40 mg total) by mouth daily.   cetirizine (ZYRTEC) 10 MG tablet Take 10 mg by mouth daily.   Cholecalciferol (VITAMIN D) 125 MCG (5000 UT) CAPS Take 5,000 Units by mouth every other day.   esomeprazole (NEXIUM) 20 MG capsule Take 20 mg by mouth every other day.   ibuprofen (ADVIL) 200 MG tablet Take 400-800 mg by mouth every 6 (six) hours as needed for moderate pain.   lisinopril (ZESTRIL) 40 MG tablet Take 40 mg by mouth daily.   metoprolol tartrate (LOPRESSOR) 25 MG tablet Take 1 tablet (25 mg total) by mouth 2 (two) times daily.   nitroGLYCERIN (NITROSTAT) 0.4 MG SL tablet Place 1 tablet (0.4 mg total) under the tongue every 5 (five) minutes as needed for chest pain.     Allergies:    Mucinex [guaifenesin er]   Social History: Social History   Socioeconomic History   Marital status: Married    Spouse name: Not on file   Number of children: 3   Years of education: Not on file   Highest education level: Not on file  Occupational History   Occupation: Neurosurgeon  Tobacco Use   Smoking status: Former    Types: Cigarettes  Smokeless tobacco: Never  Vaping Use   Vaping Use: Never used  Substance and Sexual Activity   Alcohol use: No   Drug use: No   Sexual activity: Yes    Birth control/protection: Surgical    Comment: tubal  Other Topics Concern   Not on file  Social History Narrative   Not on file   Social Determinants of Health   Financial Resource Strain: Low Risk  (02/07/2022)   Overall Financial Resource Strain (CARDIA)    Difficulty of Paying Living Expenses: Not hard at all  Food Insecurity: No Food Insecurity (02/07/2022)   Hunger Vital Sign    Worried About Running Out of Food in the Last Year: Never true    Ran Out of Food in the Last Year: Never true  Transportation Needs: No Transportation Needs (02/07/2022)   PRAPARE - Hydrologist (Medical): No    Lack of Transportation  (Non-Medical): No  Physical Activity: Inactive (02/07/2022)   Exercise Vital Sign    Days of Exercise per Week: 0 days    Minutes of Exercise per Session: 0 min  Stress: No Stress Concern Present (02/07/2022)   [place]    Feeling of Stress : Not at all  Social Connections: [place] (02/07/2022)   Social Connection and Isolation Panel [NHANES]    Frequency of Communication with Friends and Family: More than three times a week    Frequency of Social Gatherings with Friends and Family: More than three times a week    Attends Religious Services: More than 4 times per year    Active Member of [employer] or Organizations: Yes    Attends Music therapist: More than 4 times per year    Marital Status: Married     Family History: The patient's family history includes Cancer in her brother, father, and maternal aunt; Diabetes in her father; Heart disease in her father and mother; Hypertension in her father, mother, sister, and son; Stroke in her son.  ROS:   All other ROS reviewed and negative. Pertinent positives noted in the HPI.     EKGs/Labs/Other Studies Reviewed:   The following studies were personally reviewed by me today:  EKG:  EKG is ordered today.  The ekg ordered today demonstrates normal sinus rhythm heart rate 72, no acute ischemic changes, and was personally reviewed by me.   TTE 02/11/2022  1. Left ventricular ejection fraction, by estimation, is >75%. The left  ventricle has hyperdynamic function. The left ventricle has no regional  wall motion abnormalities. Left ventricular diastolic parameters are  indeterminate.   2. Right ventricular systolic function is normal. The right ventricular  size is normal. There is normal pulmonary artery systolic pressure.   3. The mitral valve is normal in structure. Mild mitral valve  regurgitation.   4. The aortic valve is normal in structure. Aortic valve  regurgitation is  not visualized.   Recent Labs: 11/18/2021: Hemoglobin 12.3; Platelets 262 02/03/2022: BNP 5.7; BUN 9; Creatinine, Ser 0.65; Potassium 5.2; Sodium 137   Recent Lipid Panel No results found for: "CHOL", "TRIG", "HDL", "CHOLHDL", "VLDL", "LDLCALC", "LDLDIRECT"  Physical Exam:   VS:  BP 124/72   Pulse 72   Ht 5\' 3"  (1.6 m)   Wt 200 lb 9.6 oz (91 kg)   SpO2 99%   BMI 35.53 kg/m    Wt Readings from Last 3 Encounters:  03/04/22 200 lb 9.6 oz (  91 kg)  02/07/22 201 lb 8 oz (91.4 kg)  02/03/22 199 lb 3.2 oz (90.4 kg)    General: Well nourished, well developed, in no acute distress Head: Atraumatic, normal size  Eyes: PEERLA, EOMI  Neck: Supple, no JVD Endocrine: No thryomegaly Cardiac: Normal S1, S2; RRR; no murmurs, rubs, or gallops Lungs: Clear to auscultation bilaterally, no wheezing, rhonchi or rales  Abd: Soft, nontender, no hepatomegaly  Ext: No edema, pulses 2+ Musculoskeletal: No deformities, BUE and BLE strength normal and equal Skin: Warm and dry, no rashes   Neuro: Alert and oriented to person, place, time, and situation, CNII-XII grossly intact, no focal deficits  Psych: Normal mood and affect   ASSESSMENT:   Donna Mckay is a 66 y.o. female who presents for the following: 1. Coronary artery disease involving native coronary artery of native heart with unstable angina pectoris (HCC)   2. Mixed hyperlipidemia   3. Primary hypertension     PLAN:   1. Coronary artery disease involving native coronary artery of native heart with unstable angina pectoris (HCC) 2. Mixed hyperlipidemia -Found to have significant stenosis in the proximal to mid LAD.  Continues to have symptoms of chest pain or shortness of breath despite amlodipine and metoprolol.  Symptoms are occurring at rest as well.  I believe she is developing unstable angina.  Given the tight stenosis in the proximal to mid LAD I recommended left heart catheterization with likely percutaneous coronary  intervention.  She is willing to proceed.  She will continue aspirin 81 mg daily.  We will start Lipitor 40 mg daily.  She is on metoprolol tartrate 25 mg twice daily as well as amlodipine 5 mg daily.  No further room for medical therapy.  She will see me back in 3 to 4 weeks after the heart catheterization.  3. Primary hypertension -Well-controlled on current medication.   Shared Decision Making/Informed Consent The risks [stroke (1 in 1000), death (1 in 1000), kidney failure [usually temporary] (1 in 500), bleeding (1 in 200), allergic reaction [possibly serious] (1 in 200)], benefits (diagnostic support and management of coronary artery disease) and alternatives of a cardiac catheterization were discussed in detail with Ms. Krukowski and she is willing to proceed.  Disposition: Return in about 1 month (around 04/04/2022).  Medication Adjustments/Labs and Tests Ordered: Current medicines are reviewed at length with the patient today.  Concerns regarding medicines are outlined above.  Orders Placed This Encounter  Procedures   Basic metabolic panel   CBC   EKG 12-Lead   Meds ordered this encounter  Medications   atorvastatin (LIPITOR) 40 MG tablet    Sig: Take 1 tablet (40 mg total) by mouth daily.    Dispense:  90 tablet    Refill:  3    Patient Instructions  Medication Instructions:  START Lipitor 40 mg daily   *If you need a refill on your cardiac medications before your next appointment, please call your pharmacy*   Lab Work: CBC, BMET TODAY   If you have labs (blood work) drawn today and your tests are completely normal, you will receive your results only by: MyChart Message (if you have MyChart) OR A paper copy in the mail If you have any lab test that is abnormal or we need to change your treatment, we will call you to review the results.   Testing/Procedures:  Your physician has requested that you have a cardiac catheterization. Cardiac catheterization is used to  diagnose and/or treat various  heart conditions. Doctors may recommend this procedure for a number of different reasons. The most common reason is to evaluate chest pain. Chest pain can be a symptom of coronary artery disease (CAD), and cardiac catheterization can show whether plaque is narrowing or blocking your heart's arteries. This procedure is also used to evaluate the valves, as well as measure the blood flow and oxygen levels in different parts of your heart. For further information please visit [email or web address] Please follow instruction sheet, as given.    Follow-Up: At [facility], you and your health needs are our priority.  As part of our continuing mission to provide you with exceptional heart care, we have created designated Provider Care Teams.  These Care Teams include your primary Cardiologist (physician) and Advanced Practice Providers (APPs -  Physician Assistants and Nurse Practitioners) who all work together to provide you with the care you need, when you need it.  We recommend signing up for the patient portal called "MyChart".  Sign up information is provided on this After Visit Summary.  MyChart is used to connect with patients for Virtual Visits (Telemedicine).  Patients are able to view lab/test results, encounter notes, upcoming appointments, etc.  Non-urgent messages can be sent to your provider as well.   To learn more about what you can do with MyChart, go to [email or web address].    Your next appointment:   August 24th at 1:20 PM  The format for your next appointment:   In Person  Provider:   Lennie Odor, MD    Other Instructions  [facility] GROUP [facility] CARDIOVASCULAR DIVISION [facility] [address] [place] [address] [place] [address] Dept: [phone] Loc: [phone]  Donna Mckay  03/04/2022  You are scheduled for a Cardiac Catheterization on August 8th with Dr.Smith  1. Please arrive at the Main Entrance A at  [facility]: [address] [place], [place] [address] at 5:30 AM (This time is two hours before your procedure to ensure your preparation). Free valet parking service is available.   Special note: Every effort is made to have your procedure done on time. Please understand that emergencies sometimes delay scheduled procedures.  2. Diet: Do not eat solid foods after midnight.  You may have clear liquids until 5 AM upon the day of the procedure.  3. Labs: You will need to have blood drawn today- CBC, BMET. You do not need to be fasting.  4. Medication instructions in preparation for your procedure:   Contrast Allergy: No  On the morning of your procedure, take Aspirin and any morning medicines NOT listed above.  You may use sips of water.  5. Plan to go home the same day, you will only stay overnight if medically necessary. 6. You MUST have a responsible adult to drive you home. 7. An adult MUST be with you the first 24 hours after you arrive home. 8. Bring a current list of your medications, and the last time and date medication taken. 9. Bring ID and current insurance cards. 10.Please wear clothes that are easy to get on and off and wear slip-on shoes.  Thank you for allowing [place] to care for you!   -- [place] [facility]           Time Spent with Patient: I have spent a total of 35 minutes with patient reviewing hospital notes, telemetry, EKGs, labs and examining the patient as well as establishing an assessment and plan that was discussed with  the patient.  > 50% of time was spent in direct patient care.  Signed, Lenna Gilford. Flora Lipps, MD, [facility]  [facility]  [address] [place], [place] [address] [phone]  03/04/2022 3:19 PM

## 2022-03-04 ENCOUNTER — Ambulatory Visit: Payer: PPO | Admitting: Cardiovascular Disease

## 2022-03-04 ENCOUNTER — Encounter: Payer: Self-pay | Admitting: Cardiovascular Disease

## 2022-03-04 VITALS — BP 124/72 | HR 72 | Ht 63.0 in | Wt 200.6 lb

## 2022-03-04 DIAGNOSIS — I10 Essential (primary) hypertension: Secondary | ICD-10-CM

## 2022-03-04 DIAGNOSIS — E782 Mixed hyperlipidemia: Secondary | ICD-10-CM | POA: Diagnosis not present

## 2022-03-04 DIAGNOSIS — I25110 Atherosclerotic heart disease of native coronary artery with unstable angina pectoris: Secondary | ICD-10-CM | POA: Diagnosis not present

## 2022-03-04 MED ORDER — ATORVASTATIN CALCIUM 40 MG PO TABS: 40.0000 mg | ORAL_TABLET | Freq: Every day | ORAL | 3 refills | Status: DC

## 2022-03-04 NOTE — Patient Instructions (Addendum)
Medication Instructions:  START Lipitor 40 mg daily   *If you need a refill on your cardiac medications before your next appointment, please call your pharmacy*   Lab Work: CBC, BMET TODAY   If you have labs (blood work) drawn today and your tests are completely normal, you will receive your results only by: MyChart Message (if you have MyChart) OR A paper copy in the mail If you have any lab test that is abnormal or we need to change your treatment, we will call you to review the results.   Testing/Procedures:  Your physician has requested that you have a cardiac catheterization. Cardiac catheterization is used to diagnose and/or treat various heart conditions. Doctors may recommend this procedure for a number of different reasons. The most common reason is to evaluate chest pain. Chest pain can be a symptom of coronary artery disease (CAD), and cardiac catheterization can show whether plaque is narrowing or blocking your heart's arteries. This procedure is also used to evaluate the valves, as well as measure the blood flow and oxygen levels in different parts of your heart. For further information please visit [email or web address] Please follow instruction sheet, as given.    Follow-Up: At [facility], you and your health needs are our priority.  As part of our continuing mission to provide you with exceptional heart care, we have created designated Provider Care Teams.  These Care Teams include your primary Cardiologist (physician) and Advanced Practice Providers (APPs -  Physician Assistants and Nurse Practitioners) who all work together to provide you with the care you need, when you need it.  We recommend signing up for the patient portal called "MyChart".  Sign up information is provided on this After Visit Summary.  MyChart is used to connect with patients for Virtual Visits (Telemedicine).  Patients are able to view lab/test results, encounter notes, upcoming appointments, etc.   Non-urgent messages can be sent to your provider as well.   To learn more about what you can do with MyChart, go to [email or web address].    Your next appointment:   August 24th at 1:20 PM  The format for your next appointment:   In Person  Provider:   Lennie Odor, MD    Other Instructions  [facility] GROUP [facility] CARDIOVASCULAR DIVISION [facility] [address] [place] [address] [place] [address] Dept: [phone] Loc: [phone]  Donna Mckay  03/04/2022  You are scheduled for a Cardiac Catheterization on August 8th with Dr.Smith  1. Please arrive at the Main Entrance A at [facility]: [address] [place], [place] [address] at 5:30 AM (This time is two hours before your procedure to ensure your preparation). Free valet parking service is available.   Special note: Every effort is made to have your procedure done on time. Please understand that emergencies sometimes delay scheduled procedures.  2. Diet: Do not eat solid foods after midnight.  You may have clear liquids until 5 AM upon the day of the procedure.  3. Labs: You will need to have blood drawn today- CBC, BMET. You do not need to be fasting.  4. Medication instructions in preparation for your procedure:   Contrast Allergy: No  On the morning of your procedure, take Aspirin and any morning medicines NOT listed above.  You may use sips of water.  5. Plan to go home the same day, you will only stay overnight if medically necessary. 6. You MUST have a responsible adult to drive you home.  7. An adult MUST be with you the first 24 hours after you arrive home. 8. Bring a current list of your medications, and the last time and date medication taken. 9. Bring ID and current insurance cards. 10.Please wear clothes that are easy to get on and off and wear slip-on shoes.  Thank you for allowing [place] to care for you!   -- [place] [facility]

## 2022-03-05 LAB — BASIC METABOLIC PANEL
BUN/Creatinine Ratio: 15 (ref 12–28)
BUN: 10 mg/dL (ref 8–27)
CO2: 21 mmol/L (ref 20–29)
Calcium: 9.1 mg/dL (ref 8.7–10.3)
Chloride: 105 mmol/L (ref 96–106)
Creatinine, Ser: 0.65 mg/dL (ref 0.57–1.00)
Glucose: 89 mg/dL (ref 70–99)
Potassium: 4.6 mmol/L (ref 3.5–5.2)
Sodium: 143 mmol/L (ref 134–144)
eGFR: 97 mL/min/{1.73_m2} (ref >59)

## 2022-03-05 LAB — CBC
Hematocrit: 39.8 % (ref 34.0–46.6)
Hemoglobin: 13.8 g/dL (ref 11.1–15.9)
MCH: 29.5 pg (ref 26.6–33.0)
MCHC: 34.7 g/dL (ref 31.5–35.7)
MCV: 85 fL (ref 79–97)
Platelets: 288 10*3/uL (ref 150–450)
RBC: 4.68 x10E6/uL (ref 3.77–5.28)
RDW: 13.4 % (ref 11.7–15.4)
WBC: 4.6 10*3/uL (ref 3.4–10.8)

## 2022-03-10 NOTE — H&P (Signed)
66 yo with tight mid LAD on CCTA. Other territories clean. LVEF 75%. Hyperlipidemia, and Htn. CP off and on since 10/2021.

## 2022-03-11 ENCOUNTER — Encounter (HOSPITAL_COMMUNITY)
Admission: RE | Disposition: A | Discharge status: Home / Self Care | Payer: Self-pay | Source: Home / Self Care | Attending: Interventional Cardiology

## 2022-03-11 ENCOUNTER — Other Ambulatory Visit (HOSPITAL_COMMUNITY): Payer: Self-pay

## 2022-03-11 ENCOUNTER — Other Ambulatory Visit: Payer: Self-pay

## 2022-03-11 ENCOUNTER — Ambulatory Visit (HOSPITAL_COMMUNITY)
Admission: RE | Admit: 2022-03-11 | Discharge: 2022-03-11 | Disposition: A | Discharge status: Home / Self Care | Payer: PPO | Attending: Interventional Cardiology | Admitting: Interventional Cardiology

## 2022-03-11 DIAGNOSIS — E785 Hyperlipidemia, unspecified: Secondary | ICD-10-CM

## 2022-03-11 DIAGNOSIS — R0609 Other forms of dyspnea: Secondary | ICD-10-CM | POA: Diagnosis not present

## 2022-03-11 DIAGNOSIS — I25110 Atherosclerotic heart disease of native coronary artery with unstable angina pectoris: Secondary | ICD-10-CM | POA: Diagnosis not present

## 2022-03-11 DIAGNOSIS — I25118 Atherosclerotic heart disease of native coronary artery with other forms of angina pectoris: Secondary | ICD-10-CM | POA: Diagnosis not present

## 2022-03-11 DIAGNOSIS — E782 Mixed hyperlipidemia: Secondary | ICD-10-CM | POA: Insufficient documentation

## 2022-03-11 DIAGNOSIS — Z87891 Personal history of nicotine dependence: Secondary | ICD-10-CM | POA: Insufficient documentation

## 2022-03-11 DIAGNOSIS — Z7982 Long term (current) use of aspirin: Secondary | ICD-10-CM | POA: Diagnosis not present

## 2022-03-11 DIAGNOSIS — Z955 Presence of coronary angioplasty implant and graft: Secondary | ICD-10-CM | POA: Diagnosis not present

## 2022-03-11 DIAGNOSIS — I2510 Atherosclerotic heart disease of native coronary artery without angina pectoris: Secondary | ICD-10-CM | POA: Diagnosis present

## 2022-03-11 DIAGNOSIS — I10 Essential (primary) hypertension: Secondary | ICD-10-CM | POA: Insufficient documentation

## 2022-03-11 DIAGNOSIS — Z79899 Other long term (current) drug therapy: Secondary | ICD-10-CM | POA: Insufficient documentation

## 2022-03-11 HISTORY — PX: LEFT HEART CATH AND CORONARY ANGIOGRAPHY: CATH118249

## 2022-03-11 HISTORY — PX: CORONARY STENT INTERVENTION: CATH118234

## 2022-03-11 LAB — POCT ACTIVATED CLOTTING TIME
Activated Clotting Time: 582 seconds
Activated Clotting Time: 480 seconds

## 2022-03-11 SURGERY — LEFT HEART CATH AND CORONARY ANGIOGRAPHY
Anesthesia: LOCAL

## 2022-03-11 MED ORDER — PANTOPRAZOLE SODIUM 40 MG PO TBEC: 40.0000 mg | DELAYED_RELEASE_TABLET | Freq: Every day | ORAL | 3 refills | Status: DC

## 2022-03-11 MED ORDER — HEPARIN (PORCINE) IN NACL 1000-0.9 UT/500ML-% IV SOLN
INTRAVENOUS | Status: AC
  Filled 2022-03-11: qty 1000

## 2022-03-11 MED ORDER — CLOPIDOGREL BISULFATE 75 MG PO TABS: 75.0000 mg | ORAL_TABLET | Freq: Every day | ORAL | Status: DC

## 2022-03-11 MED ORDER — BIVALIRUDIN BOLUS VIA INFUSION - CUPID
INTRAVENOUS | Status: DC | PRN
  Administered 2022-03-11: 68.025 mg via INTRAVENOUS

## 2022-03-11 MED ORDER — CLOPIDOGREL BISULFATE 75 MG PO TABS
75.0000 mg | ORAL_TABLET | Freq: Every day | ORAL | 11 refills | Status: DC
  Filled 2022-03-11: qty 30, 30d supply, fill #0

## 2022-03-11 MED ORDER — HEPARIN SODIUM (PORCINE) 1000 UNIT/ML IJ SOLN
INTRAMUSCULAR | Status: AC
  Filled 2022-03-11: qty 10

## 2022-03-11 MED ORDER — ASPIRIN 81 MG PO CHEW: 81.0000 mg | CHEWABLE_TABLET | ORAL | Status: DC

## 2022-03-11 MED ORDER — CLOPIDOGREL BISULFATE 75 MG PO TABS: 75.0000 mg | ORAL_TABLET | Freq: Every day | ORAL | 11 refills | Status: DC

## 2022-03-11 MED ORDER — VERAPAMIL HCL 2.5 MG/ML IV SOLN
INTRAVENOUS | Status: AC
  Filled 2022-03-11: qty 2

## 2022-03-11 MED ORDER — SODIUM CHLORIDE 0.9% FLUSH: 3.0000 mL | Freq: Two times a day (BID) | INTRAVENOUS | Status: DC

## 2022-03-11 MED ORDER — SODIUM CHLORIDE 0.9 % WEIGHT BASED INFUSION
3.0000 mL/kg/h | INTRAVENOUS | Status: AC
  Administered 2022-03-11: 3 mL/kg/h via INTRAVENOUS

## 2022-03-11 MED ORDER — LIDOCAINE HCL (PF) 1 % IJ SOLN
INTRAMUSCULAR | Status: DC | PRN
  Administered 2022-03-11: 20 mL via INTRADERMAL
  Administered 2022-03-11: 2 mL via INTRADERMAL

## 2022-03-11 MED ORDER — FENTANYL CITRATE (PF) 100 MCG/2ML IJ SOLN
INTRAMUSCULAR | Status: DC | PRN
  Administered 2022-03-11: 50 ug via INTRAVENOUS
  Administered 2022-03-11: 25 ug via INTRAVENOUS

## 2022-03-11 MED ORDER — HEPARIN SODIUM (PORCINE) 1000 UNIT/ML IJ SOLN
INTRAMUSCULAR | Status: DC | PRN
  Administered 2022-03-11: 5000 [IU] via INTRAVENOUS

## 2022-03-11 MED ORDER — SODIUM CHLORIDE 0.9% FLUSH: 3.0000 mL | INTRAVENOUS | Status: DC | PRN

## 2022-03-11 MED ORDER — NITROGLYCERIN 1 MG/10 ML FOR IR/CATH LAB
INTRA_ARTERIAL | Status: AC
  Filled 2022-03-11: qty 10

## 2022-03-11 MED ORDER — FENTANYL CITRATE (PF) 100 MCG/2ML IJ SOLN
INTRAMUSCULAR | Status: AC
  Filled 2022-03-11: qty 2

## 2022-03-11 MED ORDER — SODIUM CHLORIDE 0.9 % IV SOLN: 250.0000 mL | INTRAVENOUS | Status: DC | PRN

## 2022-03-11 MED ORDER — SODIUM CHLORIDE 0.9 % WEIGHT BASED INFUSION: 1.0000 mL/kg/h | INTRAVENOUS | Status: DC

## 2022-03-11 MED ORDER — MIDAZOLAM HCL 2 MG/2ML IJ SOLN
INTRAMUSCULAR | Status: DC | PRN
  Administered 2022-03-11: 1.5 mg via INTRAVENOUS
  Administered 2022-03-11: .5 mg via INTRAVENOUS

## 2022-03-11 MED ORDER — SODIUM CHLORIDE 0.9 % IV SOLN: INTRAVENOUS | Status: AC

## 2022-03-11 MED ORDER — HYDRALAZINE HCL 20 MG/ML IJ SOLN: 10.0000 mg | INTRAMUSCULAR | Status: AC | PRN

## 2022-03-11 MED ORDER — OXYCODONE HCL 5 MG PO TABS: 5.0000 mg | ORAL_TABLET | ORAL | Status: DC | PRN

## 2022-03-11 MED ORDER — MIDAZOLAM HCL 2 MG/2ML IJ SOLN
INTRAMUSCULAR | Status: AC
  Filled 2022-03-11: qty 2

## 2022-03-11 MED ORDER — LIDOCAINE HCL (PF) 1 % IJ SOLN
INTRAMUSCULAR | Status: AC
  Filled 2022-03-11: qty 30

## 2022-03-11 MED ORDER — PANTOPRAZOLE SODIUM 40 MG PO TBEC
40.0000 mg | DELAYED_RELEASE_TABLET | Freq: Every day | ORAL | 3 refills | Status: DC
  Filled 2022-03-11: qty 90, 90d supply, fill #0

## 2022-03-11 MED ORDER — LABETALOL HCL 5 MG/ML IV SOLN: 10.0000 mg | INTRAVENOUS | Status: AC | PRN

## 2022-03-11 MED ORDER — NITROGLYCERIN 1 MG/10 ML FOR IR/CATH LAB
INTRA_ARTERIAL | Status: DC | PRN
  Administered 2022-03-11: 200 ug via INTRACORONARY

## 2022-03-11 MED ORDER — CLOPIDOGREL BISULFATE 300 MG PO TABS
ORAL_TABLET | ORAL | Status: AC
  Filled 2022-03-11: qty 2

## 2022-03-11 MED ORDER — ONDANSETRON HCL 4 MG/2ML IJ SOLN: 4.0000 mg | Freq: Four times a day (QID) | INTRAMUSCULAR | Status: DC | PRN

## 2022-03-11 MED ORDER — SODIUM CHLORIDE 0.9 % IV SOLN
INTRAVENOUS | Status: DC | PRN
  Administered 2022-03-11: 1.75 mg/kg/h via INTRAVENOUS

## 2022-03-11 MED ORDER — ASPIRIN 81 MG PO CHEW: 81.0000 mg | CHEWABLE_TABLET | Freq: Every day | ORAL | Status: DC

## 2022-03-11 MED ORDER — CLOPIDOGREL BISULFATE 300 MG PO TABS
ORAL_TABLET | ORAL | Status: DC | PRN
  Administered 2022-03-11: 600 mg via ORAL

## 2022-03-11 MED ORDER — VERAPAMIL HCL 2.5 MG/ML IV SOLN
INTRAVENOUS | Status: DC | PRN
  Administered 2022-03-11: 10 mL via INTRA_ARTERIAL

## 2022-03-11 MED ORDER — ACETAMINOPHEN 325 MG PO TABS: 650.0000 mg | ORAL_TABLET | ORAL | Status: DC | PRN

## 2022-03-11 MED ORDER — HEPARIN (PORCINE) IN NACL 1000-0.9 UT/500ML-% IV SOLN
INTRAVENOUS | Status: DC | PRN
  Administered 2022-03-11 (×2): 500 mL

## 2022-03-11 SURGICAL SUPPLY — 25 items
BALL SAPPHIRE NC24 3.25X12 (BALLOONS) ×2
BALLN SAPPHIRE 2.5X12 (BALLOONS) ×2
BALLOON SAPPHIRE 2.5X12 (BALLOONS) IMPLANT
BALLOON SAPPHIRE NC24 3.25X12 (BALLOONS) IMPLANT
CATH 5FR JL3.5 JR4 ANG PIG MP (CATHETERS) ×1 IMPLANT
CATH 5FR JL4 DIAGNOSTIC (CATHETERS) ×1 IMPLANT
CATH VISTA GUIDE 6FR XBLAD3.0 (CATHETERS) ×1 IMPLANT
CATH VISTA GUIDE 6FR XBLAD3.5 (CATHETERS) ×1 IMPLANT
DEVICE RAD COMP TR BAND LRG (VASCULAR PRODUCTS) ×1 IMPLANT
GLIDESHEATH SLEND A-KIT 6F 22G (SHEATH) ×1 IMPLANT
GUIDEWIRE INQWIRE 1.5J.035X260 (WIRE) IMPLANT
INQWIRE 1.5J .035X260CM (WIRE) ×2
KIT ENCORE 26 ADVANTAGE (KITS) ×1 IMPLANT
KIT ESSENTIALS PG (KITS) ×1 IMPLANT
KIT HEART LEFT (KITS) ×3 IMPLANT
NDL PERC 18GX7CM (NEEDLE) IMPLANT
NEEDLE PERC 18GX7CM (NEEDLE) ×2 IMPLANT
PACK CARDIAC CATHETERIZATION (CUSTOM PROCEDURE TRAY) ×3 IMPLANT
SHEATH PINNACLE 6F 10CM (SHEATH) ×1 IMPLANT
STENT SYNERGY XD 3.0X16 (Permanent Stent) IMPLANT
SYNERGY XD 3.0X16 (Permanent Stent) ×2 IMPLANT
TRANSDUCER W/STOPCOCK (MISCELLANEOUS) ×3 IMPLANT
TUBING CIL FLEX 10 FLL-RA (TUBING) ×3 IMPLANT
WIRE ASAHI PROWATER 180CM (WIRE) ×1 IMPLANT
WIRE EMERALD 3MM-J .035X150CM (WIRE) ×1 IMPLANT

## 2022-03-11 NOTE — Interval H&P Note (Signed)
Cath Lab Visit (complete for each Cath Lab visit)  Clinical Evaluation Leading to the Procedure:   ACS: Yes.    Non-ACS:    Anginal Classification: CCS III  Anti-ischemic medical therapy: Maximal Therapy (2 or more classes of medications)  Non-Invasive Test Results: High-risk stress test findings: cardiac mortality >3%/year  Prior CABG: No previous CABG      History and Physical Interval Note:  03/11/2022 7:19 AM  Donna Mckay  has presented today for surgery, with the diagnosis of unstable angina.  The various methods of treatment have been discussed with the patient and family. After consideration of risks, benefits and other options for treatment, the patient has consented to  Procedure(s): LEFT HEART CATH AND CORONARY ANGIOGRAPHY (N/A) as a surgical intervention.  The patient's history has been reviewed, patient examined, no change in status, stable for surgery.  I have reviewed the patient's chart and labs.  Questions were answered to the patient's satisfaction.     Lyn Records III

## 2022-03-11 NOTE — Discharge Summary (Cosign Needed)
Discharge Summary for Same Day PCI   Patient ID: Donna Mckay MRN: [id number]; DOB: 01-24-1956  Admit date: 03/11/2022 Discharge date: 03/11/2022  Primary Care Provider: Carylon Perches, MD  Primary Cardiologist: Reatha Harps, MD  Primary Electrophysiologist:  None   Discharge Diagnoses    Active Problems:   CAD (coronary artery disease), native coronary artery   Hypertension   Hyperlipidemia with target LDL less than 70  Diagnostic Studies/Procedures    Cardiac Catheterization 03/11/2022:  CORONARY STENT INTERVENTION  LEFT HEART CATH AND CORONARY ANGIOGRAPHY   Conclusion  CONCLUSIONS: 99% mid LAD treated with 16 mm Synergy stent postdilated to 3.25 mm in diameter with pre and post TIMI grade III flow noted.  Post PCI stenosis 0%.  No complications. Coronary arteries otherwise normal.   RECOMMENDATIONS:   Aggressive lipid-lowering. Aspirin and Plavix for 6-12 months.  Could de-escalate at 6 months by dropping aspirin and continuing clopidogrel monotherapy. Consider cardiac rehab. Same-day discharge assuming no femoral complications. Diagnostic Dominance: Right  Intervention     History of Present Illness     Donna Mckay is a 66 y.o. female with history of hypertension who recently establish care with Dr. Flora Lipps for evaluation of chest pain and dyspnea on exertion which was concerning for angina.  Follow-up coronary CT showed calcium score of 113 and significant stenosis in the mid LAD by CT FFR study. Cardiac catheterization was arranged for further evaluation.  Started on Lipitor 40 mg daily.  Hospital Course     The patient underwent cardiac cath as noted above with 99% mid LAD stenosis status post DES PCI with Synergy stent. Plan for DAPT with ASA/Plavix for at least 6 to 12 months.  Could use Plavix as monotherapy after 6 months.. The patient was seen by cardiac rehab while in short stay. There were no observed complications post cath. Radial & femoral cath site was  re-evaluated prior to discharge and found to be stable without any complications. Instructions/precautions regarding cath site care were given prior to discharge.  [name] was seen by Dr. Katrinka Blazing and determined stable for discharge home. Follow up with our office has been arranged. Medications are listed below. Pertinent changes include adding Plavix.  Cath/PCI Registry Performance & Quality Measures: Aspirin prescribed? - Yes ADP Receptor Inhibitor (Plavix/Clopidogrel, Brilinta/Ticagrelor or Effient/Prasugrel) prescribed (includes medically managed patients)? - Yes High Intensity Statin (Lipitor 40-80mg  or Crestor 20-40mg ) prescribed? - Yes For EF <40%, was ACEI/ARB prescribed? - Yes For EF <40%, Aldosterone Antagonist (Spironolactone or Eplerenone) prescribed? - Not Applicable (EF >/= 40%) Cardiac Rehab Phase II ordered (Included Medically managed Patients)? - Yes   Discharge Vitals Blood pressure (!) 127/51, pulse 64, temperature 97.8 F (36.6 C), temperature source Temporal, resp. rate 15, height 5' 3.5" (1.613 m), weight 90.7 kg, SpO2 99 %.  Filed Weights   03/11/22 0613  Weight: 90.7 kg    Last Labs & Radiologic Studies    _____________  CARDIAC CATHETERIZATION  Result Date: 03/11/2022 CONCLUSIONS: 99% mid LAD treated with 16 mm Synergy stent postdilated to 3.25 mm in diameter with pre and post TIMI grade III flow noted.  Post PCI stenosis 0%.  No complications. Coronary arteries otherwise normal. RECOMMENDATIONS: Aggressive lipid-lowering. Aspirin and Plavix for 6-12 months.  Could de-escalate at 6 months by dropping aspirin and continuing clopidogrel monotherapy. Consider cardiac rehab. Same-day discharge assuming no femoral complications.   CT CORONARY FRACTIONAL FLOW RESERVE DATA PREP  Result Date: 02/26/2022 EXAM: CT FFR ANALYSIS CLINICAL DATA:  cad FINDINGS: FFRct analysis was performed on the original cardiac CT angiogram dataset. Diagrammatic representation of the FFRct  analysis is provided in a separate PDF document in PACS. This dictation was created using the PDF document and an interactive 3D model of the results. 3D model is not available in the EMR/PACS. Normal FFR range is >0.80. Indeterminate (grey) zone is 0.76-0.80. 1. Left Main: FFR = 0.98 2. LAD: Proximal FFR = 0.98, mid FFR = 0.61, distal FFR = 0.54 3. LCX: Proximal FFR = 0.98, distal FFR = 0.96 4. RCA: Proximal FFR = 0.97, mid FFR =0.95, distal FFR = 0.96 IMPRESSION: 1.  CT FFR analysis showed significant stenosis in the mid LAD. RECOMMENDATIONS: Recommend Cardiac Catheterization Electronically Signed   By: Thomasene Ripple D.O.   On: 02/26/2022 16:18   CT CORONARY MORPH W/CTA COR W/SCORE W/CA W/CM &/OR WO/CM  Addendum Date: 02/26/2022   ADDENDUM REPORT: 02/26/2022 13:15 CLINICAL DATA:  This is a 66 year old female with anginal symptoms EXAM: Cardiac/Coronary  CTA TECHNIQUE: The patient was scanned on a [employer]. FINDINGS: A 100 kV prospective scan was triggered in the descending thoracic aorta at 111 HU's. Axial non-contrast 3 mm slices were carried out through the heart. The data set was analyzed on a dedicated work station and scored using the Agatson method. Gantry rotation speed was 250 msecs and collimation was .6 mm. No beta blockade and 0.8 mg of sl NTG was given. The 3D data set was reconstructed in 5% intervals of the 67-82 % of the R-R cycle. Diastolic phases were analyzed on a dedicated work station using MPR, MIP and VRT modes. The patient received 80 cc of contrast. Aorta: Normal size. Mild aortic root calcifications. No dissection. Aortic Valve:  Trileaflet.  No calcifications. Coronary Arteries:  Normal coronary origin.  Right dominance. RCA is a large dominant artery that gives rise to PDA and PLA. There is no plaque. Left main is a large artery that gives rise to LAD and LCX arteries. LAD is a large vessel. There is a long moderate (50-69%) calcified lesion which starts from the ostium  of the LAD and extends into the proximal portion of the vessel. In the mid LAD there is a soft plaque which appears to be severe (>70%) likely even a subtotal occlusion. D1 is a medium sized vessel with a high take off, no plaques. LCX is a non-dominant artery that gives rise to one large OM1 branch. There is a minimal (<24%) calcified plaque in the proximal LCX. Mild (24-49%) focal calcified plaque in the mid LCX. The distal LCX with no plaques. OM1 and OM2 with no plaques Coronary Calcium Score: Left main: 0 Left anterior descending artery: 86.1 Left circumflex artery: 31.3 Right coronary artery: 0 Total: 113 Percentile: 81 Other findings: Normal pulmonary vein drainage into the left atrium. Normal left atrial appendage without a thrombus. Normal size of the pulmonary artery. IMPRESSION: 1. Coronary calcium score of 113. This was 3 percentile for age and sex matched control. 2. Normal coronary origin with right dominance. 3. CAD-RADS 4 Severe stenosis. (70-99% or > 50% left main). Cardiac catheterization is recommended. Will also send for CT FFR. The noncardiac portion of this study will be interpreted in separate report by the radiologist. Electronically Signed   By: Thomasene Ripple D.O.   On: 02/26/2022 13:15   Result Date: 02/26/2022 EXAM: OVER-READ INTERPRETATION  CT CHEST The following report is a limited chest CT over-read performed by radiologist Dr. Zollie Beckers  [facility], [place] on 02/26/2022. This over-read does not include interpretation of cardiac or coronary anatomy or pathology. The cardiac CTA interpretation by the cardiologist is to be attached. COMPARISON:  Chest radiograph 11/18/2021 FINDINGS: Extracardiac vascular: Descending thoracic aortic atherosclerotic vascular calcification. Mediastinum/Nodes: Unremarkable Lungs/Pleura: Scarring or atelectasis in the lingula and anteriorly in the left lower lobe. Upper Abdomen: Unremarkable Musculoskeletal: Unremarkable IMPRESSION: 1.  Descending thoracic aortic atherosclerosis. Aortic Atherosclerosis (ICD10-I70.0). 2. Scarring or atelectasis in the lingula and anterior left lower lobe. Electronically Signed: By: Gaylyn Rong M.D. On: 02/26/2022 12:12   ECHOCARDIOGRAM COMPLETE  Result Date: 02/11/2022    ECHOCARDIOGRAM REPORT   Patient Name:   Donna Mckay Date of Exam: 02/11/2022 Medical Rec #:  [id number]    Height:       63.0 in Accession #:    [id number]   Weight:       201.5 lb Date of Birth:  05/31/1956    BSA:          1.940 m Patient Age:    66 years     BP:           142/77 mmHg Patient Gender: F            HR:           74 bpm. [facility] Procedure: 2D Echo, Cardiac Doppler and Color Doppler Indications:    R07.2 (ICD-10-CM) - Precordial pain                 R06.02 (ICD-10-CM) - SOB (shortness of breath) on exertion  History:        Patient has no prior history of Echocardiogram examinations.                 Risk Factors:Hypertension. Hx of COVID-19.  Sonographer:    Celesta Gentile RCS Referring Phys: [id number] Ronnald Ramp O'NEAL IMPRESSIONS  1. Left ventricular ejection fraction, by estimation, is >75%. The left ventricle has hyperdynamic function. The left ventricle has no regional wall motion abnormalities. Left ventricular diastolic parameters are indeterminate.  2. Right ventricular systolic function is normal. The right ventricular size is normal. There is normal pulmonary artery systolic pressure.  3. The mitral valve is normal in structure. Mild mitral valve regurgitation.  4. The aortic valve is normal in structure. Aortic valve regurgitation is not visualized. FINDINGS  Left Ventricle: Left ventricular ejection fraction, by estimation, is >75%. The left ventricle has hyperdynamic function. The left ventricle has no regional wall motion abnormalities. The left ventricular internal cavity size was normal in size. There is no left ventricular hypertrophy. Left ventricular diastolic parameters are  indeterminate. Right Ventricle: The right ventricular size is normal. Right vetricular wall thickness was not assessed. Right ventricular systolic function is normal. There is normal pulmonary artery systolic pressure. The tricuspid regurgitant velocity is 2.04 m/s, and with an assumed right atrial pressure of 3 mmHg, the estimated right ventricular systolic pressure is 19.6 mmHg. Left Atrium: Left atrial size was normal in size. Right Atrium: Right atrial size was normal in size. Pericardium: There is no evidence of pericardial effusion. Mitral Valve: The mitral valve is normal in structure. Mild mitral valve regurgitation. Tricuspid Valve: The tricuspid valve is normal in structure. Tricuspid valve regurgitation is mild. Aortic Valve: The aortic valve is normal in structure. Aortic valve regurgitation is not visualized. Pulmonic Valve: The pulmonic valve was normal in structure. Pulmonic valve regurgitation is trivial. Aorta: The aortic root is  normal in size and structure. IAS/Shunts: No atrial level shunt detected by color flow Doppler.  LEFT VENTRICLE PLAX 2D LVIDd:         3.80 cm   Diastology LVIDs:         1.80 cm   LV e' medial:    5.00 cm/s LV PW:         0.90 cm   LV E/e' medial:  15.2 LV IVS:        0.90 cm   LV e' lateral:   6.31 cm/s LVOT diam:     2.00 cm   LV E/e' lateral: 12.0 LV SV:         91 LV SV Index:   47 LVOT Area:     3.14 cm  RIGHT VENTRICLE RV S prime:     16.50 cm/s TAPSE (M-mode): 2.5 cm LEFT ATRIUM             Index        RIGHT ATRIUM           Index LA diam:        3.75 cm 1.93 cm/m   RA Area:     13.10 cm LA Vol (A2C):   37.8 ml 19.49 ml/m  RA Volume:   29.50 ml  15.21 ml/m LA Vol (A4C):   43.8 ml 22.58 ml/m LA Biplane Vol: 41.8 ml 21.55 ml/m  AORTIC VALVE LVOT Vmax:   115.00 cm/s LVOT Vmean:  81.100 cm/s LVOT VTI:    0.289 m  AORTA Ao Root diam: 3.40 cm MITRAL VALVE               TRICUSPID VALVE MV Area (PHT): 3.42 cm    TR Peak grad:   16.6 mmHg MV Decel Time: 222 msec     TR Vmax:        204.00 cm/s MV E velocity: 75.80 cm/s MV A velocity: 97.70 cm/s  SHUNTS MV E/A ratio:  0.78        Systemic VTI:  0.29 m                            Systemic Diam: 2.00 cm Dietrich Pates MD Electronically signed by Dietrich Pates MD Signature Date/Time: 02/11/2022/5:43:19 PM    Final     Disposition   Pt is being discharged home today in good condition.  Follow-up Plans & Appointments     Follow-up Information     O'Neal, Ronnald Ramp, MD Follow up on 03/27/2022.   Specialties: Cardiology, Internal Medicine, Radiology Why: @1 :20pm for cath follow up Contact information: [address] [place] [phone]                  Discharge Medications   Allergies as of 03/11/2022       Reactions   Mucinex [guaifenesin Er] Hives        Medication List     STOP taking these medications    esomeprazole 20 MG capsule Commonly known as: NEXIUM       TAKE these medications    albuterol 108 (90 Base) MCG/ACT inhaler Commonly known as: VENTOLIN HFA Inhale 2 puffs into the lungs every 4 (four) hours as needed for wheezing or shortness of breath.   amLODipine 5 MG tablet Commonly known as: NORVASC Take 5 mg by mouth daily.   aspirin EC 81 MG tablet Take 1 tablet (81 mg total) by mouth daily.  Swallow whole.   atorvastatin 40 MG tablet Commonly known as: LIPITOR Take 1 tablet (40 mg total) by mouth daily.   cetirizine 10 MG tablet Commonly known as: ZYRTEC Take 10 mg by mouth daily.   clopidogrel 75 MG tablet Commonly known as: Plavix Take 1 tablet (75 mg total) by mouth daily.   lisinopril 40 MG tablet Commonly known as: ZESTRIL Take 40 mg by mouth daily.   metoprolol tartrate 25 MG tablet Commonly known as: LOPRESSOR Take 1 tablet (25 mg total) by mouth 2 (two) times daily.   nitroGLYCERIN 0.4 MG SL tablet Commonly known as: NITROSTAT Place 1 tablet (0.4 mg total) under the tongue every 5 (five) minutes as needed for chest  pain.   pantoprazole 40 MG tablet Commonly known as: Protonix Take 1 tablet (40 mg total) by mouth daily.   Vitamin D 125 MCG (5000 UT) Caps Take 5,000 Units by mouth every other day.           Allergies Allergies  Allergen Reactions   Mucinex [Guaifenesin Er] Hives    Outstanding Labs/Studies   None  Duration of Discharge Encounter   Greater than 30 minutes including physician time.  Lorelei PontSigned, Bhavinkumar Bhagat, PA 03/11/2022, 10:42 AM

## 2022-03-11 NOTE — Progress Notes (Signed)
Patient received in holding area TR band present on right radial, spo2 98% as measured in right thumb. Level 0.  62fr sheath present in right groin , level 0, bilateral dp and pt pulses palpable.

## 2022-03-11 NOTE — Progress Notes (Signed)
CARDIAC REHAB PHASE I   Stent education completed with pt and family. Pt educated on importance of ASA and Plavix. Pt given stent card and heart healthy diet. Reviewed site care, restrictions, and exercise guidelines. Will refer to CRP II [place].  [id number] Reynold Bowen, RN BSN 03/11/2022 1:18 PM

## 2022-03-11 NOTE — CV Procedure (Signed)
Mid LAD 99% stenosis stented with a 3.0x16 Synergy postdilated to 3.25 mm in diameter. Unsuccessful radial cath due to angulation and short ascending aorta. Normal RCA, circumflex, and LAD other than the mid vessel stenosis. Normal LV and LV EDP.   Plan discharge later this evening assuming groin stable.

## 2022-03-11 NOTE — Progress Notes (Signed)
47fr sheath aspirated and removed from rfa, manual pressure applied for 20 minutes, site level 0, no s+s of hematoma. Tegaderm dressing applied, bedrest instructions given.   Bedrest begins at 11:15:00

## 2022-03-12 ENCOUNTER — Encounter (HOSPITAL_COMMUNITY): Payer: Self-pay | Admitting: Interventional Cardiology

## 2022-03-18 DIAGNOSIS — E785 Hyperlipidemia, unspecified: Secondary | ICD-10-CM | POA: Diagnosis not present

## 2022-03-18 DIAGNOSIS — I25119 Atherosclerotic heart disease of native coronary artery with unspecified angina pectoris: Secondary | ICD-10-CM | POA: Diagnosis not present

## 2022-03-26 NOTE — Progress Notes (Unsigned)
Cardiology Office Note:   Date:  03/27/2022  NAME:  Donna Mckay    MRN: [id number] DOB:  17-Nov-1955   PCP:  Carylon Perches, MD  Cardiologist:  Reatha Harps, MD  Electrophysiologist:  None   Referring MD: Carylon Perches, MD   Chief Complaint  Patient presents with   Follow-up        History of Present Illness:   Donna Mckay is a 66 y.o. female with a hx of hypertension, hyperlipidemia, CAD who presents for follow-up.  Underwent PCI to the mid LAD on 03/11/2022.  Presents for follow-up after heart catheterization.  She underwent PCI to her mid LAD.  She is doing well.  No chest pain or trouble breathing.  Symptoms have all improved after PCI.  Her blood pressure is well controlled.  Lipids will need to be rechecked in 2 to 3 months.  She has no exercise restrictions.  Her EKG is nonischemic.  Her echo showed normal LV function.  She overall is doing quite well since her heart stent procedure.  Problem List CAD -CAC score 113 (81st precentile) -70-99% mid LAD CT FFR 0.61 2. HTN 3. HLD -T chol 219, HDL 72, triglycerides 92, LDL 131  Past Medical History: Past Medical History:  Diagnosis Date   Allergy    Hypertension    PONV (postoperative nausea and vomiting)     Past Surgical History: Past Surgical History:  Procedure Laterality Date   COLONOSCOPY WITH PROPOFOL N/A 10/25/2021   Procedure: COLONOSCOPY WITH PROPOFOL;  Surgeon: Corbin Ade, MD;  Location: [facility];  Service: Endoscopy;  Laterality: N/A;  7:30 / ASA 2   CORONARY STENT INTERVENTION N/A 03/11/2022   Procedure: CORONARY STENT INTERVENTION;  Surgeon: Lyn Records, MD;  Location: [facility];  Service: Cardiovascular;  Laterality: N/A;   ENDOMETRIAL ABLATION     LEFT HEART CATH AND CORONARY ANGIOGRAPHY N/A 03/11/2022   Procedure: LEFT HEART CATH AND CORONARY ANGIOGRAPHY;  Surgeon: Lyn Records, MD;  Location: [facility];  Service: Cardiovascular;  Laterality: N/A;   TUBAL LIGATION      Current  Medications: Current Meds  Medication Sig   albuterol (VENTOLIN HFA) 108 (90 Base) MCG/ACT inhaler Inhale 2 puffs into the lungs every 4 (four) hours as needed for wheezing or shortness of breath.   amLODipine (NORVASC) 5 MG tablet Take 5 mg by mouth daily.   aspirin EC 81 MG tablet Take 1 tablet (81 mg total) by mouth daily. Swallow whole.   atorvastatin (LIPITOR) 40 MG tablet Take 1 tablet (40 mg total) by mouth daily.   cetirizine (ZYRTEC) 10 MG tablet Take 10 mg by mouth daily.   Cholecalciferol (VITAMIN D) 125 MCG (5000 UT) CAPS Take 5,000 Units by mouth every other day.   clopidogrel (PLAVIX) 75 MG tablet Take 1 tablet (75 mg total) by mouth daily.   lisinopril (ZESTRIL) 40 MG tablet Take 40 mg by mouth daily.   metoprolol tartrate (LOPRESSOR) 25 MG tablet Take 1 tablet (25 mg total) by mouth 2 (two) times daily.   nitroGLYCERIN (NITROSTAT) 0.4 MG SL tablet Place 1 tablet (0.4 mg total) under the tongue every 5 (five) minutes as needed for chest pain.   pantoprazole (PROTONIX) 40 MG tablet Take 1 tablet (40 mg total) by mouth daily.     Allergies:    Mucinex [guaifenesin er]   Social History: Social History   Socioeconomic History   Marital status: Married  Spouse name: Not on file   Number of children: 3   Years of education: Not on file   Highest education level: Not on file  Occupational History   Occupation: Neurosurgeon  Tobacco Use   Smoking status: Former    Types: Cigarettes   Smokeless tobacco: Never  Vaping Use   Vaping Use: Never used  Substance and Sexual Activity   Alcohol use: No   Drug use: No   Sexual activity: Yes    Birth control/protection: Surgical    Comment: tubal  Other Topics Concern   Not on file  Social History Narrative   Not on file   Social Determinants of Health   Financial Resource Strain: Low Risk  (02/07/2022)   Overall Financial Resource Strain (CARDIA)    Difficulty of Paying Living Expenses: Not hard at all  Food Insecurity:  No Food Insecurity (02/07/2022)   Hunger Vital Sign    Worried About Running Out of Food in the Last Year: Never true    Ran Out of Food in the Last Year: Never true  Transportation Needs: No Transportation Needs (02/07/2022)   PRAPARE - Administrator, Civil Service (Medical): No    Lack of Transportation (Non-Medical): No  Physical Activity: Inactive (02/07/2022)   Exercise Vital Sign    Days of Exercise per Week: 0 days    Minutes of Exercise per Session: 0 min  Stress: No Stress Concern Present (02/07/2022)   [employer] of Occupational Health - Occupational Stress Questionnaire    Feeling of Stress : Not at all  Social Connections: Socially Integrated (02/07/2022)   Social Connection and Isolation Panel [NHANES]    Frequency of Communication with Friends and Family: More than three times a week    Frequency of Social Gatherings with Friends and Family: More than three times a week    Attends Religious Services: More than 4 times per year    Active Member of [employer] or Organizations: Yes    Attends Engineer, structural: More than 4 times per year    Marital Status: Married     Family History: The patient's family history includes Cancer in her brother, father, and maternal aunt; Diabetes in her father; Heart disease in her father and mother; Hypertension in her father, mother, sister, and son; Stroke in her son.  ROS:   All other ROS reviewed and negative. Pertinent positives noted in the HPI.     EKGs/Labs/Other Studies Reviewed:   The following studies were personally reviewed by me today:  EKG:  EKG is ordered today.  The ekg ordered today demonstrates normal sinus rhythm heart rate 65, LVH, and was personally reviewed by me.   [facility] 03/11/2022 CONCLUSIONS: 99% mid LAD treated with 16 mm Synergy stent postdilated to 3.25 mm in diameter with pre and post TIMI grade III flow noted.  Post PCI stenosis 0%.  No complications. Coronary arteries otherwise  normal.  Recent Labs: 02/03/2022: BNP 5.7 03/04/2022: BUN 10; Creatinine, Ser 0.65; Hemoglobin 13.8; Platelets 288; Potassium 4.6; Sodium 143   Recent Lipid Panel No results found for: "CHOL", "TRIG", "HDL", "CHOLHDL", "VLDL", "LDLCALC", "LDLDIRECT"  Physical Exam:   VS:  BP 116/60   Pulse 65   Ht 5' 3.5" (1.613 m)   Wt 201 lb 6.4 oz (91.4 kg)   SpO2 94%   BMI 35.12 kg/m    Wt Readings from Last 3 Encounters:  03/27/22 201 lb 6.4 oz (91.4 kg)  03/11/22 200 lb (90.7  kg)  03/04/22 200 lb 9.6 oz (91 kg)    General: Well nourished, well developed, in no acute distress Head: Atraumatic, normal size  Eyes: PEERLA, EOMI  Neck: Supple, no JVD Endocrine: No thryomegaly Cardiac: Normal S1, S2; RRR; no murmurs, rubs, or gallops Lungs: Clear to auscultation bilaterally, no wheezing, rhonchi or rales  Abd: Soft, nontender, no hepatomegaly  Ext: No edema, pulses 2+ Musculoskeletal: No deformities, BUE and BLE strength normal and equal Skin: Warm and dry, no rashes   Neuro: Alert and oriented to person, place, time, and situation, CNII-XII grossly intact, no focal deficits  Psych: Normal mood and affect   ASSESSMENT:   Donna Mckay is a 66 y.o. female who presents for the following: 1. Coronary artery disease of native artery of native heart with stable angina pectoris (HCC)   2. Mixed hyperlipidemia   3. Primary hypertension     PLAN:   1. Coronary artery disease of native artery of native heart with stable angina pectoris (HCC) 2. Mixed hyperlipidemia -Found to have 99% mid LAD stenosis on coronary CTA.  Status post PCI.  Continue aspirin and Plavix for 6 months.  She is on Lipitor 40 mg daily.  She will come back in 3 months for repeat lipids.  I will then see her in 3 to 6 months.  Her blood pressure is well controlled.  She will continue metoprolol and amlodipine.  She has sublingual nitroglycerin but should not need any.  She overall is doing quite well.  We discussed exercise goals  as well as dietary goals.  She will work on all of this.  She overall is doing quite well and her symptoms have resolved after her PCI.  3. Primary hypertension -Well-controlled.  No change in medications.  Disposition: Return in about 3 months (around 06/27/2022).  Medication Adjustments/Labs and Tests Ordered: Current medicines are reviewed at length with the patient today.  Concerns regarding medicines are outlined above.  Orders Placed This Encounter  Procedures   Lipid panel   EKG 12-Lead   No orders of the defined types were placed in this encounter.   Patient Instructions  Medication Instructions:  The current medical regimen is effective;  continue present plan and medications.  *If you need a refill on your cardiac medications before your next appointment, please call your pharmacy*   Lab Work: LIPID (before 3 month appointment, no lab appointment needed)   If you have labs (blood work) drawn today and your tests are completely normal, you will receive your results only by: MyChart Message (if you have MyChart) OR A paper copy in the mail If you have any lab test that is abnormal or we need to change your treatment, we will call you to review the results.   Follow-Up: At [facility], you and your health needs are our priority.  As part of our continuing mission to provide you with exceptional heart care, we have created designated Provider Care Teams.  These Care Teams include your primary Cardiologist (physician) and Advanced Practice Providers (APPs -  Physician Assistants and Nurse Practitioners) who all work together to provide you with the care you need, when you need it.  We recommend signing up for the patient portal called "MyChart".  Sign up information is provided on this After Visit Summary.  MyChart is used to connect with patients for Virtual Visits (Telemedicine).  Patients are able to view lab/test results, encounter notes, upcoming appointments, etc.   Non-urgent messages can be  sent to your provider as well.   To learn more about what you can do with MyChart, go to [email or web address].    Your next appointment:   3 month(s)  The format for your next appointment:   In Person  Provider:   Marjie Skiff, PA-C or Azalee Course, PA-C    Then, Reatha Harps, MD will plan to see you again in 6 month(s).{             Time Spent with Patient: I have spent a total of 25 minutes with patient reviewing hospital notes, telemetry, EKGs, labs and examining the patient as well as establishing an assessment and plan that was discussed with the patient.  > 50% of time was spent in direct patient care.  Signed, Lenna Gilford. Flora Lipps, MD, [facility]  [facility]  [address] [place], [place] [address] [phone]  03/27/2022 1:49 PM

## 2022-03-27 ENCOUNTER — Ambulatory Visit: Payer: PPO | Admitting: Cardiovascular Disease

## 2022-03-27 ENCOUNTER — Encounter: Payer: Self-pay | Admitting: Cardiovascular Disease

## 2022-03-27 VITALS — BP 116/60 | HR 65 | Ht 63.5 in | Wt 201.4 lb

## 2022-03-27 DIAGNOSIS — I10 Essential (primary) hypertension: Secondary | ICD-10-CM | POA: Diagnosis not present

## 2022-03-27 DIAGNOSIS — E782 Mixed hyperlipidemia: Secondary | ICD-10-CM

## 2022-03-27 DIAGNOSIS — I25118 Atherosclerotic heart disease of native coronary artery with other forms of angina pectoris: Secondary | ICD-10-CM | POA: Diagnosis not present

## 2022-03-27 NOTE — Patient Instructions (Signed)
Medication Instructions:  The current medical regimen is effective;  continue present plan and medications.  *If you need a refill on your cardiac medications before your next appointment, please call your pharmacy*   Lab Work: LIPID (before 3 month appointment, no lab appointment needed)   If you have labs (blood work) drawn today and your tests are completely normal, you will receive your results only by: MyChart Message (if you have MyChart) OR A paper copy in the mail If you have any lab test that is abnormal or we need to change your treatment, we will call you to review the results.   Follow-Up: At [facility], you and your health needs are our priority.  As part of our continuing mission to provide you with exceptional heart care, we have created designated Provider Care Teams.  These Care Teams include your primary Cardiologist (physician) and Advanced Practice Providers (APPs -  Physician Assistants and Nurse Practitioners) who all work together to provide you with the care you need, when you need it.  We recommend signing up for the patient portal called "MyChart".  Sign up information is provided on this After Visit Summary.  MyChart is used to connect with patients for Virtual Visits (Telemedicine).  Patients are able to view lab/test results, encounter notes, upcoming appointments, etc.  Non-urgent messages can be sent to your provider as well.   To learn more about what you can do with MyChart, go to [email or web address].    Your next appointment:   3 month(s)  The format for your next appointment:   In Person  Provider:   Marjie Skiff, PA-C or Azalee Course, PA-C    Then, Reatha Harps, MD will plan to see you again in 6 month(s).{

## 2022-05-27 DIAGNOSIS — Z79899 Other long term (current) drug therapy: Secondary | ICD-10-CM | POA: Diagnosis not present

## 2022-05-27 DIAGNOSIS — I10 Essential (primary) hypertension: Secondary | ICD-10-CM | POA: Diagnosis not present

## 2022-05-27 DIAGNOSIS — E785 Hyperlipidemia, unspecified: Secondary | ICD-10-CM | POA: Diagnosis not present

## 2022-05-27 DIAGNOSIS — M1990 Unspecified osteoarthritis, unspecified site: Secondary | ICD-10-CM | POA: Diagnosis not present

## 2022-06-03 ENCOUNTER — Other Ambulatory Visit (HOSPITAL_COMMUNITY): Payer: Self-pay | Admitting: Internal Medicine

## 2022-06-03 DIAGNOSIS — Z780 Asymptomatic menopausal state: Secondary | ICD-10-CM

## 2022-06-03 DIAGNOSIS — I10 Essential (primary) hypertension: Secondary | ICD-10-CM | POA: Diagnosis not present

## 2022-06-03 DIAGNOSIS — Z23 Encounter for immunization: Secondary | ICD-10-CM | POA: Diagnosis not present

## 2022-06-03 DIAGNOSIS — I2510 Atherosclerotic heart disease of native coronary artery without angina pectoris: Secondary | ICD-10-CM | POA: Diagnosis not present

## 2022-06-03 DIAGNOSIS — E785 Hyperlipidemia, unspecified: Secondary | ICD-10-CM | POA: Diagnosis not present

## 2022-06-03 DIAGNOSIS — R7309 Other abnormal glucose: Secondary | ICD-10-CM | POA: Diagnosis not present

## 2022-06-09 ENCOUNTER — Other Ambulatory Visit (HOSPITAL_COMMUNITY): Payer: PPO

## 2022-06-18 ENCOUNTER — Other Ambulatory Visit (HOSPITAL_COMMUNITY): Payer: PPO

## 2022-06-20 ENCOUNTER — Inpatient Hospital Stay (HOSPITAL_COMMUNITY): Admission: RE | Admit: 2022-06-20 | Payer: PPO | Source: Ambulatory Visit

## 2022-06-25 ENCOUNTER — Ambulatory Visit (HOSPITAL_COMMUNITY)
Admission: RE | Admit: 2022-06-25 | Discharge: 2022-06-25 | Disposition: A | Discharge status: Home / Self Care | Payer: PPO | Source: Ambulatory Visit | Attending: Internal Medicine | Admitting: Internal Medicine

## 2022-06-25 DIAGNOSIS — Z780 Asymptomatic menopausal state: Secondary | ICD-10-CM | POA: Diagnosis not present

## 2022-06-25 DIAGNOSIS — M8588 Other specified disorders of bone density and structure, other site: Secondary | ICD-10-CM | POA: Diagnosis not present

## 2022-06-28 NOTE — Progress Notes (Unsigned)
Cardiology Office Note:    Date:  07/01/2022   ID:  Donna SleightWanda O Mckay, DOB [birth year], MRN [id number]  PCP:  Carylon PerchesFagan, Roy, MD  Cardiologist:  Reatha HarpsWesley T O'Neal, MD  Electrophysiologist:  None   Referring MD: Carylon PerchesFagan, Roy, MD   Chief Complaint: routine follow-up of CAD  History of Present Illness:    Donna Mckay is a 66 y.o. female with a history of CAD s/p DES to LAD in 03/2022, hypertension, and hyperlipidemia who is followed by Dr. Flora Lipps'Neal and presents today for routine follow-up.   Patient was referred to Dr. Flora Lipps'Neal in 02/2022 for further evaluation of chest pain as well as exertional shortness of breath. A Coronary CTA and Echo were ordered for further evaluation. Echo showed LVEF of >75% with no regional wall motion abnormalities, normal RV, and mild MR. Coronary CTA showed a coronary calcium score of 113 with severe stenosis noted in the mid LAD. This led to a cardiac catheterization on 03/11/2022 which confirmed a 99% stenosis of the mid LAD. Otherwise, coronaries were clean. She underwent successful PCI with DES of the mid LAD.   Patient was last seen by Dr. Flora Lipps'Neal on 03/27/2022 at which time she was doing well with no recurrent symptoms since PCI.  Patient presents today for follow-up. Here with husband. Patient is doing very well from a cardiac standpoint. She denies any chest pain or significant shortness of breath since her PCI. She states she has her "usual shortness of breath" if she overexerts herself which she attributes to her age and weight but this is not new and is significantly better than what she was having prior to PCI. No shortness of breath at rest. No orthopnea or PND. She occasionally has lower extremity edema at the end of the day especially during the summer in the heat but no issues during the colder months. No palpitations, lightheadedness, dizziness, or syncope. She is compliant with all her medications and tolerating them well.  [facility]: - Lipid panel: Total Cholesterol 144, Triglycerides 86, HDL 64, LDL 64. - CBC: WBC 4.8, Hgb 12.6, Plts 261 - CMET: Na 140, K 4.2, Glucose 107, Cr 0.73  Past Medical History:  Diagnosis Date   Allergy    CAD (coronary artery disease)    s/p DES to LAD in 03/2022   Hyperlipidemia    Hypertension    PONV (postoperative nausea and vomiting)     Past Surgical History:  Procedure Laterality Date   COLONOSCOPY WITH PROPOFOL N/A 10/25/2021   Procedure: COLONOSCOPY WITH PROPOFOL;  Surgeon: Corbin Adeourk, Robert M, MD;  [facility];  Service: Endoscopy;  Laterality: N/A;  7:30 / ASA 2   CORONARY STENT INTERVENTION N/A 03/11/2022   Procedure: CORONARY STENT INTERVENTION;  Surgeon: Lyn RecordsSmith, Henry W, MD;  Location: [facility];  Service: Cardiovascular;  Laterality: N/A;   ENDOMETRIAL ABLATION     LEFT HEART CATH AND CORONARY ANGIOGRAPHY N/A 03/11/2022   Procedure: LEFT HEART CATH AND CORONARY ANGIOGRAPHY;  Surgeon: Lyn RecordsSmith, Henry W, MD;  Location: [facility];  Service: Cardiovascular;  Laterality: N/A;   TUBAL LIGATION      Current Medications: Current Meds  Medication Sig   albuterol (VENTOLIN HFA) 108 (90 Base) MCG/ACT inhaler Inhale 2 puffs into the lungs every 4 (four) hours as needed for wheezing or shortness of breath.   amLODipine (NORVASC) 5 MG tablet Take 5 mg by mouth daily.   aspirin EC  81 MG tablet Take 1 tablet (81 mg total) by mouth daily. Swallow whole.   atorvastatin (LIPITOR) 40 MG tablet Take 1 tablet (40 mg total) by mouth daily.   cetirizine (ZYRTEC) 10 MG tablet Take 10 mg by mouth daily.   Cholecalciferol (VITAMIN D) 125 MCG (5000 UT) CAPS Take 5,000 Units by mouth every other day.   clopidogrel (PLAVIX) 75 MG tablet Take 1 tablet (75 mg total) by mouth daily.   lisinopril (ZESTRIL) 40 MG tablet Take 40 mg by mouth daily.   metoprolol tartrate (LOPRESSOR) 25 MG tablet Take 1 tablet (25 mg total) by mouth 2 (two) times daily.   pantoprazole  (PROTONIX) 40 MG tablet Take 1 tablet (40 mg total) by mouth daily.     Allergies:   Mucinex [guaifenesin er]   Social History   Socioeconomic History   Marital status: Married    Spouse name: Not on file   Number of children: 3   Years of education: Not on file   Highest education level: Not on file  Occupational History   Occupation: Neurosurgeon  Tobacco Use   Smoking status: Former    Types: Cigarettes   Smokeless tobacco: Never  Vaping Use   Vaping Use: Never used  Substance and Sexual Activity   Alcohol use: No   Drug use: No   Sexual activity: Yes    Birth control/protection: Surgical    Comment: tubal  Other Topics Concern   Not on file  Social History Narrative   Not on file   Social Determinants of Health   Financial Resource Strain: Low Risk  (02/07/2022)   Overall Financial Resource Strain (CARDIA)    Difficulty of Paying Living Expenses: Not hard at all  Food Insecurity: No Food Insecurity (02/07/2022)   Hunger Vital Sign    Worried About Running Out of Food in the Last Year: Never true    Ran Out of Food in the Last Year: Never true  Transportation Needs: No Transportation Needs (02/07/2022)   PRAPARE - Administrator, Civil Service (Medical): No    Lack of Transportation (Non-Medical): No  Physical Activity: Inactive (02/07/2022)   Exercise Vital Sign    Days of Exercise per Week: 0 days    Minutes of Exercise per Session: 0 min  Stress: No Stress Concern Present (02/07/2022)   [employer] of Occupational Health - Occupational Stress Questionnaire    Feeling of Stress : Not at all  Social Connections: Socially Integrated (02/07/2022)   Social Connection and Isolation Panel [NHANES]    Frequency of Communication with Friends and Family: More than three times a week    Frequency of Social Gatherings with Friends and Family: More than three times a week    Attends Religious Services: More than 4 times per year    Active Member of [employer] or  Organizations: Yes    Attends Engineer, structural: More than 4 times per year    Marital Status: Married     Family History: The patient's family history includes Cancer in her brother, father, and maternal aunt; Diabetes in her father; Heart disease in her father and mother; Hypertension in her father, mother, sister, and son; Stroke in her son.  ROS:   Please see the history of present illness.     EKGs/Labs/Other Studies Reviewed:    The following studies were reviewed:  Echocardiogram 02/11/2022: Impressions:  1. Left ventricular ejection fraction, by estimation, is >75%. The left  ventricle has  hyperdynamic function. The left ventricle has no regional  wall motion abnormalities. Left ventricular diastolic parameters are  indeterminate.   2. Right ventricular systolic function is normal. The right ventricular  size is normal. There is normal pulmonary artery systolic pressure.   3. The mitral valve is normal in structure. Mild mitral valve  regurgitation.   4. The aortic valve is normal in structure. Aortic valve regurgitation is  not visualized.  _______________  Coronary CTA 02/26/2022: Impression: 1. Coronary calcium score of 113. This was 36 percentile for age and sex matched control. 2. Normal coronary origin with right dominance. 3. CAD-RADS 4 Severe stenosis. (70-99% or > 50% left main). Cardiac catheterization is recommended. FFR showed significant stenosis in the mid LAD. _______________  Left Cardiac Catheterization 03/11/2022: Conclusions: 99% mid LAD treated with 16 mm Synergy stent postdilated to 3.25 mm in diameter with pre and post TIMI grade III flow noted.  Post PCI stenosis 0%.  No complications. Coronary arteries otherwise normal.   Recommendations: Aggressive lipid-lowering. Aspirin and Plavix for 6-12 months.  Could de-escalate at 6 months by dropping aspirin and continuing clopidogrel monotherapy. Consider cardiac rehab. Same-day  discharge assuming no femoral complications.  Diagnostic Dominance: Right  Intervention    EKG:  EKG not ordered today.   Recent Labs: 02/03/2022: BNP 5.7 03/04/2022: BUN 10; Creatinine, Ser 0.65; Hemoglobin 13.8; Platelets 288; Potassium 4.6; Sodium 143  Recent Lipid Panel No results found for: "CHOL", "TRIG", "HDL", "CHOLHDL", "VLDL", "LDLCALC", "LDLDIRECT"  Physical Exam:    Vital Signs: BP 122/68   Pulse 80   Ht 5\' 4"  (1.626 m)   Wt 204 lb 3.2 oz (92.6 kg)   SpO2 97%   BMI 35.05 kg/m     Wt Readings from Last 3 Encounters:  07/01/22 204 lb 3.2 oz (92.6 kg)  03/27/22 201 lb 6.4 oz (91.4 kg)  03/11/22 200 lb (90.7 kg)     General: 66 y.o. Caucasian female in no acute distress. HEENT: Normocephalic and atraumatic. Sclera clear.  Neck: Supple. No carotid bruits. No JVD. Heart: RRR. Distinct S1 and S2. Possible I/VI systolic murmur. No gallops or rubs.Radial and distal pedal pulses 2+ and equal bilaterally. Lungs: No increased work of breathing. Clear to ausculation bilaterally. No wheezes, rhonchi, or rales.  Abdomen: Soft, non-distended, and non-tender to palpation.  Extremities: No lower extremity edema.    Skin: Warm and dry. Neuro: Alert and oriented x3. No focal deficits. Psych: Normal affect. Responds appropriately.  Assessment:    1. Coronary artery disease involving native coronary artery of native heart without angina pectoris   2. Mitral valve insufficiency, unspecified etiology   3. Primary hypertension   4. Hyperlipidemia, unspecified hyperlipidemia type     Plan:    CAD S/p DES to mid LAD in 03/2022. - Doing well. No chest pain. - Continue DAPT with Aspirin and Plavix. - Continue beta-blocker and high-intensity statin. - Recommended staying active and aiming for at least 150 minutes of physical activity every week.  Mild Mitral Regurgitation Noted on Echo in 02/2022. - Will need routine serial screening. Next Echo in 3-5 years (or sooner depending  on symptoms).   Hypertension BP well controlled.  - Continue current medications: Lopressor 25mg  twice daily, Amlodipine 5mg  daily, and Lisinopril 40mg  daily.  Hyperlipidemia Most recent lipid panel from 05/27/2022 (per [facility]): Total Cholesterol 144, Triglycerides 86, HDL 64, LDL 64. LDL goal <70 given CAD. - Continue Lipitor 40mg  daily.    Disposition: Keep follow-up with Dr.  in 09/2022 as scheduled.   Medication Adjustments/Labs and Tests Ordered: Current medicines are reviewed at length with the patient today.  Concerns regarding medicines are outlined above.  No orders of the defined types were placed in this encounter.  No orders of the defined types were placed in this encounter.   Patient Instructions  Medication Instructions:  Your physician recommends that you continue on your current medications as directed. Please refer to the Current Medication list given to you today.  *If you need a refill on your cardiac medications before your next appointment, please call your pharmacy*  Lab Work: NONE ordered at this time of appointment   If you have labs (blood work) drawn today and your tests are completely normal, you will receive your results only by: MyChart Message (if you have MyChart) OR A paper copy in the mail If you have any lab test that is abnormal or we need to change your treatment, we will call you to review the results.  Testing/Procedures: NONE ordered at this time of appointment   Follow-Up: At [facility], you and your health needs are our priority.  As part of our continuing mission to provide you with exceptional heart care, we have created designated Provider Care Teams.  These Care Teams include your primary Cardiologist (physician) and Advanced Practice Providers (APPs -  Physician Assistants and Nurse Practitioners) who all work together to provide you with the care you need, when you need it.    Your next appointment:   As  previously scheduled   The format for your next appointment:   In Person  Provider:   Reatha Harps, MD     Other Instructions  Important Information About Sugar         Signed, Corrin Parker, PA-C  07/01/2022 1:52 PM     [facility]

## 2022-06-30 ENCOUNTER — Encounter: Payer: Self-pay | Admitting: Student

## 2022-07-01 ENCOUNTER — Encounter: Payer: Self-pay | Admitting: Student

## 2022-07-01 ENCOUNTER — Ambulatory Visit: Payer: PPO | Attending: Student | Admitting: Student

## 2022-07-01 VITALS — BP 122/68 | HR 80 | Ht 64.0 in | Wt 204.2 lb

## 2022-07-01 DIAGNOSIS — I2510 Atherosclerotic heart disease of native coronary artery without angina pectoris: Secondary | ICD-10-CM

## 2022-07-01 DIAGNOSIS — I340 Nonrheumatic mitral (valve) insufficiency: Secondary | ICD-10-CM

## 2022-07-01 DIAGNOSIS — I10 Essential (primary) hypertension: Secondary | ICD-10-CM | POA: Diagnosis not present

## 2022-07-01 DIAGNOSIS — E785 Hyperlipidemia, unspecified: Secondary | ICD-10-CM | POA: Diagnosis not present

## 2022-07-01 NOTE — Patient Instructions (Signed)
Medication Instructions:  Your physician recommends that you continue on your current medications as directed. Please refer to the Current Medication list given to you today.  *If you need a refill on your cardiac medications before your next appointment, please call your pharmacy*  Lab Work: NONE ordered at this time of appointment   If you have labs (blood work) drawn today and your tests are completely normal, you will receive your results only by: MyChart Message (if you have MyChart) OR A paper copy in the mail If you have any lab test that is abnormal or we need to change your treatment, we will call you to review the results.  Testing/Procedures: NONE ordered at this time of appointment   Follow-Up: At [facility], you and your health needs are our priority.  As part of our continuing mission to provide you with exceptional heart care, we have created designated Provider Care Teams.  These Care Teams include your primary Cardiologist (physician) and Advanced Practice Providers (APPs -  Physician Assistants and Nurse Practitioners) who all work together to provide you with the care you need, when you need it.    Your next appointment:   As previously scheduled   The format for your next appointment:   In Person  Provider:   Reatha Harps, MD     Other Instructions  Important Information About Sugar

## 2022-09-07 NOTE — Progress Notes (Unsigned)
Cardiology Office Note:   Date:  09/10/2022  NAME:  Donna Mckay    MRN: [id number] DOB:  08/25/1955   PCP:  Asencion Noble, MD  Cardiologist:  Evalina Field, MD  Electrophysiologist:  None   Referring MD: Asencion Noble, MD   Chief Complaint  Patient presents with   Follow-up        History of Present Illness:   Donna Mckay is a 67 y.o. female with a hx of CAD and HLD who presents for follow-up.  She reports she is doing well.  Denies any chest pain or pressure.  She is still getting a bit short of breath but exercising often.  I encouraged her to remain active.  LDL cholesterol 64.  Close to goal.  She has completed 6 months of DAPT.  Her blood pressure has been a bit low at home.  She reports feeling a little funny.  We discussed backing off on her lisinopril.  She denies any dizziness or lightheadedness routinely.  She is going to work on losing weight.  Problem List CAD -CAC score 113 (81st precentile) -mLAD PCI 03/11/2022 2. HTN 3. HLD -T chol 144, HDL 64, LDL 64, triglycerides 86  Past Medical History: Past Medical History:  Diagnosis Date   Allergy    CAD (coronary artery disease)    s/p DES to LAD in 03/2022   Hyperlipidemia    Hypertension    PONV (postoperative nausea and vomiting)     Past Surgical History: Past Surgical History:  Procedure Laterality Date   COLONOSCOPY WITH PROPOFOL N/A 10/25/2021   Procedure: COLONOSCOPY WITH PROPOFOL;  Surgeon: Daneil Dolin, MD;  [facility];  Service: Endoscopy;  Laterality: N/A;  7:30 / ASA 2   CORONARY STENT INTERVENTION N/A 03/11/2022   Procedure: CORONARY STENT INTERVENTION;  Surgeon: Belva Crome, MD;  Location: [place] CV LAB;  Service: Cardiovascular;  Laterality: N/A;   ENDOMETRIAL ABLATION     LEFT HEART CATH AND CORONARY ANGIOGRAPHY N/A 03/11/2022   Procedure: LEFT HEART CATH AND CORONARY ANGIOGRAPHY;  Surgeon: Belva Crome, MD;  Location: [place] CV LAB;  Service: Cardiovascular;  Laterality:  N/A;   TUBAL LIGATION      Current Medications: Current Meds  Medication Sig   albuterol (VENTOLIN HFA) 108 (90 Base) MCG/ACT inhaler Inhale 2 puffs into the lungs every 4 (four) hours as needed for wheezing or shortness of breath.   amLODipine (NORVASC) 5 MG tablet Take 5 mg by mouth daily.   aspirin EC 81 MG tablet Take 1 tablet (81 mg total) by mouth daily. Swallow whole.   atorvastatin (LIPITOR) 40 MG tablet Take 1 tablet (40 mg total) by mouth daily.   cetirizine (ZYRTEC) 10 MG tablet Take 10 mg by mouth daily.   Cholecalciferol (VITAMIN D) 125 MCG (5000 UT) CAPS Take 5,000 Units by mouth every other day.   metoprolol tartrate (LOPRESSOR) 25 MG tablet Take 1 tablet (25 mg total) by mouth 2 (two) times daily.   pantoprazole (PROTONIX) 40 MG tablet Take 1 tablet (40 mg total) by mouth daily.   [DISCONTINUED] clopidogrel (PLAVIX) 75 MG tablet Take 1 tablet (75 mg total) by mouth daily.   [DISCONTINUED] lisinopril (ZESTRIL) 40 MG tablet Take 40 mg by mouth daily.     Allergies:    Mucinex [guaifenesin er]   Social History: Social History   Socioeconomic History   Marital status: Married    Spouse name: Not on file  Number of children: 3   Years of education: Not on file   Highest education level: Not on file  Occupational History   Occupation: Seamstress  Tobacco Use   Smoking status: Former    Types: Cigarettes   Smokeless tobacco: Never  Vaping Use   Vaping Use: Never used  Substance and Sexual Activity   Alcohol use: No   Drug use: No   Sexual activity: Yes    Birth control/protection: Surgical    Comment: tubal  Other Topics Concern   Not on file  Social History Narrative   Not on file   Social Determinants of Health   Financial Resource Strain: Low Risk  (02/07/2022)   Overall Financial Resource Strain (CARDIA)    Difficulty of Paying Living Expenses: Not hard at all  Food Insecurity: No Food Insecurity (02/07/2022)   Hunger Vital Sign    Worried About  Running Out of Food in the Last Year: Never true    Ran Out of Food in the Last Year: Never true  Transportation Needs: No Transportation Needs (02/07/2022)   PRAPARE - Administrator, Civil Service (Medical): No    Lack of Transportation (Non-Medical): No  Physical Activity: Inactive (02/07/2022)   Exercise Vital Sign    Days of Exercise per Week: 0 days    Minutes of Exercise per Session: 0 min  Stress: No Stress Concern Present (02/07/2022)   [employer] of Occupational Health - Occupational Stress Questionnaire    Feeling of Stress : Not at all  Social Connections: Socially Integrated (02/07/2022)   Social Connection and Isolation Panel [NHANES]    Frequency of Communication with Friends and Family: More than three times a week    Frequency of Social Gatherings with Friends and Family: More than three times a week    Attends Religious Services: More than 4 times per year    Active Member of [employer] or Organizations: Yes    Attends Engineer, structural: More than 4 times per year    Marital Status: Married     Family History: The patient's family history includes Cancer in her brother, father, and maternal aunt; Diabetes in her father; Heart disease in her father and mother; Hypertension in her father, mother, sister, and son; Stroke in her son.  ROS:   All other ROS reviewed and negative. Pertinent positives noted in the HPI.     EKGs/Labs/Other Studies Reviewed:   The following studies were personally reviewed by me today:  TTE 02/11/2022  1. Left ventricular ejection fraction, by estimation, is >75%. The left  ventricle has hyperdynamic function. The left ventricle has no regional  wall motion abnormalities. Left ventricular diastolic parameters are  indeterminate.   2. Right ventricular systolic function is normal. The right ventricular  size is normal. There is normal pulmonary artery systolic pressure.   3. The mitral valve is normal in structure. Mild  mitral valve  regurgitation.   4. The aortic valve is normal in structure. Aortic valve regurgitation is  not visualized.   Recent Labs: 02/03/2022: BNP 5.7 03/04/2022: BUN 10; Creatinine, Ser 0.65; Hemoglobin 13.8; Platelets 288; Potassium 4.6; Sodium 143   Recent Lipid Panel No results found for: "CHOL", "TRIG", "HDL", "CHOLHDL", "VLDL", "LDLCALC", "LDLDIRECT"  Physical Exam:   VS:  BP 136/68   Pulse 69   Ht 5\' 4"  (1.626 m)   Wt 206 lb (93.4 kg)   SpO2 98%   BMI 35.36 kg/m    Wt Readings  from Last 3 Encounters:  09/10/22 206 lb (93.4 kg)  07/01/22 204 lb 3.2 oz (92.6 kg)  03/27/22 201 lb 6.4 oz (91.4 kg)    General: Well nourished, well developed, in no acute distress Head: Atraumatic, normal size  Eyes: PEERLA, EOMI  Neck: Supple, no JVD Endocrine: No thryomegaly Cardiac: Normal S1, S2; RRR; no murmurs, rubs, or gallops Lungs: Clear to auscultation bilaterally, no wheezing, rhonchi or rales  Abd: Soft, nontender, no hepatomegaly  Ext: No edema, pulses 2+ Musculoskeletal: No deformities, BUE and BLE strength normal and equal Skin: Warm and dry, no rashes   Neuro: Alert and oriented to person, place, time, and situation, CNII-XII grossly intact, no focal deficits  Psych: Normal mood and affect   ASSESSMENT:   Donna Mckay is a 67 y.o. female who presents for the following: 1. Coronary artery disease involving native coronary artery of native heart without angina pectoris   2. Mixed hyperlipidemia   3. Primary hypertension     PLAN:   1. Coronary artery disease involving native coronary artery of native heart without angina pectoris 2. Mixed hyperlipidemia -Status post PCI to the mid LAD on 03/11/2022.  She has completed 6 months of DAPT.  She may stop Plavix.  Continue aspirin therapy indefinitely.  LDL cholesterol 64.  Close enough to goal.  Continue with Lipitor 40 mg daily.  Blood pressure seems to be a bit low at times.  See discussion below.  She will continue  metoprolol as well as amlodipine.  No symptoms of angina.  Echo was normal. -We discussed proper diet as well as exercise.  She is working on all of this.  3. Primary hypertension -BP a bit low at times.  We will reduce her lisinopril to 20 mg daily.  Continue amlodipine and metoprolol.  She will keep a log.  She should start exercising more.  She will let us know if her blood pressures routinely above 130/80.  Disposition: Return in about 1 year (around 09/11/2023).  Medication Adjustments/Labs and Tests Ordered: Current medicines are reviewed at length with the patient today.  Concerns regarding medicines are outlined above.  No orders of the defined types were placed in this encounter.  Meds ordered this encounter  Medications   lisinopril (ZESTRIL) 20 MG tablet    Sig: Take 1 tablet (20 mg total) by mouth daily.    Dispense:  90 tablet    Refill:  3    Patient Instructions  Medication Instructions:  STOP Plavix  DECREASE Lisinopril to 20 mg daily   *If you need a refill on your cardiac medications before your next appointment, please call your pharmacy*   Follow-Up: At [facility], you and your health needs are our priority.  As part of our continuing mission to provide you with exceptional heart care, we have created designated Provider Care Teams.  These Care Teams include your primary Cardiologist (physician) and Advanced Practice Providers (APPs -  Physician Assistants and Nurse Practitioners) who all work together to provide you with the care you need, when you need it.  We recommend signing up for the patient portal called "MyChart".  Sign up information is provided on this After Visit Summary.  MyChart is used to connect with patients for Virtual Visits (Telemedicine).  Patients are able to view lab/test results, encounter notes, upcoming appointments, etc.  Non-urgent messages can be sent to your provider as well.   To learn more about what you can do with MyChart,  go to [email or web address].    Your next appointment:   12 month(s)   Provider:   Evalina Field, MD or Sande Rives, PA  Check BP x1 daily    Time Spent with Patient: I have spent a total of 35 minutes with patient reviewing hospital notes, telemetry, EKGs, labs and examining the patient as well as establishing an assessment and plan that was discussed with the patient.  > 50% of time was spent in direct patient care.  Signed, Addison Naegeli. Audie Box, MD, [facility]  [address], [place], [place] [address] [phone]  09/10/2022 9:24 AM

## 2022-09-10 ENCOUNTER — Encounter: Payer: Self-pay | Admitting: Cardiovascular Disease

## 2022-09-10 ENCOUNTER — Ambulatory Visit: Payer: PPO | Attending: Cardiovascular Disease | Admitting: Cardiovascular Disease

## 2022-09-10 VITALS — BP 136/68 | HR 69 | Ht 64.0 in | Wt 206.0 lb

## 2022-09-10 DIAGNOSIS — I2510 Atherosclerotic heart disease of native coronary artery without angina pectoris: Secondary | ICD-10-CM | POA: Diagnosis not present

## 2022-09-10 DIAGNOSIS — I10 Essential (primary) hypertension: Secondary | ICD-10-CM

## 2022-09-10 DIAGNOSIS — E782 Mixed hyperlipidemia: Secondary | ICD-10-CM | POA: Diagnosis not present

## 2022-09-10 MED ORDER — LISINOPRIL 20 MG PO TABS: 20.0000 mg | ORAL_TABLET | Freq: Every day | ORAL | 3 refills | Status: DC

## 2022-09-10 NOTE — Patient Instructions (Addendum)
Medication Instructions:  STOP Plavix  DECREASE Lisinopril to 20 mg daily   *If you need a refill on your cardiac medications before your next appointment, please call your pharmacy*   Follow-Up: At [facility], you and your health needs are our priority.  As part of our continuing mission to provide you with exceptional heart care, we have created designated Provider Care Teams.  These Care Teams include your primary Cardiologist (physician) and Advanced Practice Providers (APPs -  Physician Assistants and Nurse Practitioners) who all work together to provide you with the care you need, when you need it.  We recommend signing up for the patient portal called "MyChart".  Sign up information is provided on this After Visit Summary.  MyChart is used to connect with patients for Virtual Visits (Telemedicine).  Patients are able to view lab/test results, encounter notes, upcoming appointments, etc.  Non-urgent messages can be sent to your provider as well.   To learn more about what you can do with MyChart, go to [email or web address].    Your next appointment:   12 month(s)   Provider:   Evalina Field, MD or Sande Rives, PA  Check BP x1 daily

## 2022-10-02 ENCOUNTER — Encounter: Payer: Self-pay | Admitting: Radiology

## 2023-02-22 ENCOUNTER — Other Ambulatory Visit: Payer: Self-pay | Admitting: Cardiovascular Disease

## 2023-03-16 ENCOUNTER — Telehealth: Payer: Self-pay | Admitting: Cardiovascular Disease

## 2023-03-16 ENCOUNTER — Ambulatory Visit: Payer: PPO | Attending: Cardiovascular Disease

## 2023-03-16 ENCOUNTER — Other Ambulatory Visit: Payer: Self-pay

## 2023-03-16 DIAGNOSIS — R002 Palpitations: Secondary | ICD-10-CM

## 2023-03-16 NOTE — Telephone Encounter (Signed)
Called patient, she states for the last few months she has noticed and increase in the "fluttering feeling" in her chest. She states it occurs at random times. She states she feels at times she is lightheaded but she does not know if this is related to vertigo, because it can improve. She does not have any chest pain. She does have SOB with activities. She states her pulse ox readings are always in the 50's or 60's but she is not sure if it is reading correctly. Blood pressure this morning was 121/55 hr 65 on her home monitor. Her mother does have a history of AFIB so she is concerned this is what is going on.   Advised I would route to MD to see if he recommends anything, do you want to order a heart monitor? Thanks!

## 2023-03-16 NOTE — Telephone Encounter (Signed)
Pt sent this via mychart to the scheduling pool:    I have had the symptoms off and on for a few months. I have had some lightheadedness when bending over but not all the time. My b/p this morning was 121/55 and heart rate was 65. When I have the fluttery feeling my heart rate is usually in the low 60's or 50's. I have an Oximeter so I can check it with that. I haven't been diagnosed with AFIB and I am experiencing the symptoms right now.     Good Morning Kyani,  Can you tell me a little more about your palpations ?    How long have you had palpitations/irregular HR/ Afib? Are you having the symptoms now?   Are you currently experiencing lightheadedness, SOB or CP?  Do you have a history of afib (atrial fibrillation) or irregular heart rhythm?   Have you checked your BP or HR? (document readings if available):   Are you experiencing any other symptoms?       Appointment Request From: Donna Mckay  With Provider: Reatha Harps [facility] at [address]]  Preferred Date Range: Any  Preferred Times: Any Time  Reason for visit: Office Visit  Comments: I have been experiencing a fluttery feeling in my chest. Whenever it does this my heart is erratic and skipping beats. I wasn't sure if this is a cause for concern and wanted to be sure everything is ok.

## 2023-03-16 NOTE — Telephone Encounter (Signed)
Called patient, aware heart monitor will be mailed out. Patient instructions sent via mychart as well.   Zio ordered.   Patient verbalized understanding.

## 2023-03-16 NOTE — Telephone Encounter (Signed)
Pt returning call

## 2023-03-16 NOTE — Telephone Encounter (Signed)
Called patient, LVM to call back to discuss.  Left call back number.   

## 2023-03-19 DIAGNOSIS — R002 Palpitations: Secondary | ICD-10-CM

## 2023-04-07 ENCOUNTER — Other Ambulatory Visit: Payer: Self-pay

## 2023-04-07 DIAGNOSIS — R002 Palpitations: Secondary | ICD-10-CM | POA: Diagnosis not present

## 2023-04-17 DIAGNOSIS — M25511 Pain in right shoulder: Secondary | ICD-10-CM | POA: Diagnosis not present

## 2023-05-01 ENCOUNTER — Other Ambulatory Visit: Payer: Self-pay

## 2023-05-01 MED ORDER — PANTOPRAZOLE SODIUM 40 MG PO TBEC: 40.0000 mg | DELAYED_RELEASE_TABLET | Freq: Every day | ORAL | 3 refills | Status: DC

## 2023-05-01 NOTE — Telephone Encounter (Signed)
Pharmacy is requesting a refill on Pantoprazole. Would Dr. Flora Lipps like to refill medication?

## 2023-06-03 ENCOUNTER — Other Ambulatory Visit (HOSPITAL_COMMUNITY): Payer: Self-pay | Admitting: Obstetrics & Gynecology

## 2023-06-03 DIAGNOSIS — Z1231 Encounter for screening mammogram for malignant neoplasm of breast: Secondary | ICD-10-CM

## 2023-06-09 DIAGNOSIS — M5451 Vertebrogenic low back pain: Secondary | ICD-10-CM | POA: Diagnosis not present

## 2023-06-11 DIAGNOSIS — Z79899 Other long term (current) drug therapy: Secondary | ICD-10-CM | POA: Diagnosis not present

## 2023-06-11 DIAGNOSIS — I2510 Atherosclerotic heart disease of native coronary artery without angina pectoris: Secondary | ICD-10-CM | POA: Diagnosis not present

## 2023-06-11 DIAGNOSIS — I10 Essential (primary) hypertension: Secondary | ICD-10-CM | POA: Diagnosis not present

## 2023-06-11 DIAGNOSIS — E785 Hyperlipidemia, unspecified: Secondary | ICD-10-CM | POA: Diagnosis not present

## 2023-06-15 ENCOUNTER — Ambulatory Visit (HOSPITAL_COMMUNITY)
Admission: RE | Admit: 2023-06-15 | Discharge: 2023-06-15 | Disposition: A | Discharge status: Home / Self Care | Payer: PPO | Source: Ambulatory Visit | Attending: Obstetrics & Gynecology | Admitting: Obstetrics & Gynecology

## 2023-06-15 DIAGNOSIS — Z1231 Encounter for screening mammogram for malignant neoplasm of breast: Secondary | ICD-10-CM | POA: Diagnosis not present

## 2023-06-17 DIAGNOSIS — R7309 Other abnormal glucose: Secondary | ICD-10-CM | POA: Diagnosis not present

## 2023-06-17 DIAGNOSIS — Z0001 Encounter for general adult medical examination with abnormal findings: Secondary | ICD-10-CM | POA: Diagnosis not present

## 2023-06-17 DIAGNOSIS — E785 Hyperlipidemia, unspecified: Secondary | ICD-10-CM | POA: Diagnosis not present

## 2023-06-17 DIAGNOSIS — I10 Essential (primary) hypertension: Secondary | ICD-10-CM | POA: Diagnosis not present

## 2023-06-17 DIAGNOSIS — I2510 Atherosclerotic heart disease of native coronary artery without angina pectoris: Secondary | ICD-10-CM | POA: Diagnosis not present

## 2023-06-17 DIAGNOSIS — Z23 Encounter for immunization: Secondary | ICD-10-CM | POA: Diagnosis not present

## 2023-06-25 DIAGNOSIS — M5451 Vertebrogenic low back pain: Secondary | ICD-10-CM | POA: Diagnosis not present

## 2023-08-23 ENCOUNTER — Other Ambulatory Visit: Payer: Self-pay | Admitting: Cardiovascular Disease

## 2023-08-27 NOTE — Progress Notes (Signed)
Cardiology Office Note:    Date:  09/07/2023   ID:  Donna Mckay, DOB Feb 20, 1956, MRN [id number]  PCP:  Carylon Perches, MD  Cardiologist:  Reatha Harps, MD     Referring MD: Carylon Perches, MD   Chief Complaint: routine follow-up of CAD  History of Present Illness:    Donna Mckay is a 68 y.o. female with a history of CAD s/p DES to LAD in 03/2022, hypertension, and hyperlipidemia who is followed by Dr. Flora Lipps and presents today for routine follow-up.   Patient was referred to Dr. Flora Lipps in 02/2022 for further evaluation of chest pain as well as exertional shortness of breath. A Coronary CTA and Echo were ordered for further evaluation. Echo showed LVEF of >75% with no regional wall motion abnormalities, normal RV, and mild MR. Coronary CTA showed a coronary calcium score of 113 with severe stenosis noted in the mid LAD. This led to a cardiac catheterization on 03/11/2022 which confirmed a 99% stenosis of the mid LAD. Otherwise, coronaries were clean. She underwent successful PCI with DES of the mid LAD.   She was last seen by Dr. Flora Lipps in 09/2022 at which time her BP had been running a little low at home but she was otherwise doing well. Lisinopril was decreased.   Patient presents today for follow-up. She is doing well since last visit. She denies any recurrent chest pain. She typically walks 35 minutes every day (except for Sunday) around her neighborhood which [place] lot [place]. She will get a little "short winded" with this but does not have to stop. She states this better than it used to be. She denies any shortness of breath when walking on a flat surface or at rest. No orthopnea or PND. She typically has some mild lower extremity edema in the summer but none now. No palpitations, dizziness, or syncope. She is working on losing weight by walking almost daily and watching her diet. She is compliant with her medications and is tolerating them well. She states she has occasional hot flashes and  wanted to make sure this was not from any of her medications which I don't think it is.   EKGs/Labs/Other Studies Reviewed:    The following studies were reviewed:  Echocardiogram 02/11/2022: Impressions:  1. Left ventricular ejection fraction, by estimation, is >75%. The left  ventricle has hyperdynamic function. The left ventricle has no regional  wall motion abnormalities. Left ventricular diastolic parameters are  indeterminate.   2. Right ventricular systolic function is normal. The right ventricular  size is normal. There is normal pulmonary artery systolic pressure.   3. The mitral valve is normal in structure. Mild mitral valve  regurgitation.   4. The aortic valve is normal in structure. Aortic valve regurgitation is  not visualized.  _______________   Coronary CTA 02/26/2022: Impression: 1. Coronary calcium score of 113. This was 27 percentile for age and sex matched control. 2. Normal coronary origin with right dominance. 3. CAD-RADS 4 Severe stenosis. (70-99% or > 50% left main). Cardiac catheterization is recommended. FFR showed significant stenosis in the mid LAD. _______________   Left Cardiac Catheterization 03/11/2022: Conclusions: 99% mid LAD treated with 16 mm Synergy stent postdilated to 3.25 mm in diameter with pre and post TIMI grade III flow noted.  Post PCI stenosis 0%.  No complications. Coronary arteries otherwise normal.   Recommendations: Aggressive lipid-lowering. Aspirin and Plavix for 6-12 months.  Could de-escalate at 6 months by dropping aspirin  and continuing clopidogrel monotherapy. Consider cardiac rehab. Same-day discharge assuming no femoral complications.   Diagnostic Dominance: Right  Intervention       EKG:  EKG ordered today.   EKG Interpretation Date/Time:  Monday September 07 2023 11:37:13 EST Ventricular Rate:  61 PR Interval:  186 QRS Duration:  76 QT Interval:  404 QTC Calculation: 406 R Axis:   -3  Text  Interpretation: Normal sinus rhythm Possible Left atrial enlargement Left ventricular hypertrophy When compared with ECG of 11-Mar-2022 09:21, No significant change was found Confirmed by Marjie Skiff [id number]) on 09/07/2023 11:48:54 AM    Recent Labs: No results found for requested labs within last 365 days.  Recent Lipid Panel No results found for: "CHOL", "TRIG", "HDL", "CHOLHDL", "VLDL", "LDLCALC", "LDLDIRECT"  Physical Exam:    Vital Signs: BP 124/66 (BP Location: Left Arm, Patient Position: Sitting, Cuff Size: Large)   Pulse 67   Ht 5' 3.5" (1.613 m)   Wt 199 lb (90.3 kg)   SpO2 96%   BMI 34.70 kg/m     Wt Readings from Last 3 Encounters:  09/07/23 199 lb (90.3 kg)  09/10/22 206 lb (93.4 kg)  07/01/22 204 lb 3.2 oz (92.6 kg)     General: 68 y.o. Caucasian female in no acute distress. HEENT: Normocephalic and atraumatic. Sclera clear.  Neck: Supple. No carotid bruits. No JVD. Heart: RRR. Distinct S1 and S2. No murmurs, gallops, or rubs.  Lungs: No increased work of breathing. Clear to ausculation bilaterally. No wheezes, rhonchi, or rales.  Extremities: No lower extremity edema.  Skin: Warm and dry. Neuro: No focal deficits. Psych: Normal affect. Responds appropriately.   Assessment:    1. Coronary artery disease involving native coronary artery of native heart without angina pectoris   2. Mitral valve insufficiency, unspecified etiology   3. Primary hypertension   4. Mixed hyperlipidemia     Plan:    CAD S/p DES to mid LAD in 03/2022. - Doing well. No chest pain.  - Continue aspirin, beta-blocker, and statin.   Mild Mitral Regurgitation Noted on Echo in 02/2022. - Will need routine serial monitoring. Repeat Echo around 02/2025 (or sooner if needed).   Hypertension BP well controlled.  - Continue current medications: Lopressor 25mg  twice daily, Amlodipine 5mg  daily, and Lisinopril 20mg  daily.   Hyperlipidemia Most recent lipid panel in 06/2023: Total  Cholesterol 143, Triglycerides 75, HDL 70, LDL 58. LDL goal <70 given CAD.  - Continue Lipitor 40mg  daily.   Disposition: Follow up in 1 year.    Signed, Corrin Parker, PA-C  09/07/2023 12:15 PM    [facility]

## 2023-09-07 ENCOUNTER — Ambulatory Visit: Payer: PPO | Attending: Student | Admitting: Student

## 2023-09-07 ENCOUNTER — Encounter: Payer: Self-pay | Admitting: Student

## 2023-09-07 VITALS — BP 124/66 | HR 67 | Ht 63.5 in | Wt 199.0 lb

## 2023-09-07 DIAGNOSIS — E782 Mixed hyperlipidemia: Secondary | ICD-10-CM | POA: Diagnosis not present

## 2023-09-07 DIAGNOSIS — I10 Essential (primary) hypertension: Secondary | ICD-10-CM | POA: Diagnosis not present

## 2023-09-07 DIAGNOSIS — I2510 Atherosclerotic heart disease of native coronary artery without angina pectoris: Secondary | ICD-10-CM | POA: Diagnosis not present

## 2023-09-07 DIAGNOSIS — I340 Nonrheumatic mitral (valve) insufficiency: Secondary | ICD-10-CM

## 2023-09-07 NOTE — Patient Instructions (Addendum)
Medication Instructions:  The current medical regimen is effective;  continue present plan and medications as directed. Please refer to the Current Medication list given to you today.  *If you need a refill on your cardiac medications before your next appointment, please call your pharmacy*  Lab Work: NONE  Testing/Procedures: NONE  Follow-Up: At [facility], you and your health needs are our priority.  As part of our continuing mission to provide you with exceptional heart care, we have created designated Provider Care Teams.  These Care Teams include your primary Cardiologist (physician) and Advanced Practice Providers (APPs -  Physician Assistants and Nurse Practitioners) who all work together to provide you with the care you need, when you need it.  Your next appointment:   12 month(s)  Provider:   Reatha Harps, MD

## 2023-10-12 DIAGNOSIS — R062 Wheezing: Secondary | ICD-10-CM | POA: Diagnosis not present

## 2023-10-12 DIAGNOSIS — J069 Acute upper respiratory infection, unspecified: Secondary | ICD-10-CM | POA: Diagnosis not present

## 2023-11-24 ENCOUNTER — Other Ambulatory Visit: Payer: Self-pay | Admitting: Cardiovascular Disease

## 2023-12-15 DIAGNOSIS — I10 Essential (primary) hypertension: Secondary | ICD-10-CM | POA: Diagnosis not present

## 2023-12-15 DIAGNOSIS — I2510 Atherosclerotic heart disease of native coronary artery without angina pectoris: Secondary | ICD-10-CM | POA: Diagnosis not present

## 2024-04-15 DIAGNOSIS — M5451 Vertebrogenic low back pain: Secondary | ICD-10-CM | POA: Diagnosis not present

## 2024-04-29 ENCOUNTER — Other Ambulatory Visit: Payer: Self-pay | Admitting: Cardiovascular Disease

## 2024-06-13 DIAGNOSIS — I10 Essential (primary) hypertension: Secondary | ICD-10-CM | POA: Diagnosis not present

## 2024-06-13 DIAGNOSIS — E785 Hyperlipidemia, unspecified: Secondary | ICD-10-CM | POA: Diagnosis not present

## 2024-06-13 DIAGNOSIS — I2510 Atherosclerotic heart disease of native coronary artery without angina pectoris: Secondary | ICD-10-CM | POA: Diagnosis not present

## 2024-06-13 DIAGNOSIS — Z79899 Other long term (current) drug therapy: Secondary | ICD-10-CM | POA: Diagnosis not present

## 2024-06-17 DIAGNOSIS — R7301 Impaired fasting glucose: Secondary | ICD-10-CM | POA: Diagnosis not present

## 2024-06-17 DIAGNOSIS — I2510 Atherosclerotic heart disease of native coronary artery without angina pectoris: Secondary | ICD-10-CM | POA: Diagnosis not present

## 2024-06-17 DIAGNOSIS — E785 Hyperlipidemia, unspecified: Secondary | ICD-10-CM | POA: Diagnosis not present

## 2024-06-17 DIAGNOSIS — Z23 Encounter for immunization: Secondary | ICD-10-CM | POA: Diagnosis not present

## 2024-06-17 DIAGNOSIS — Z0001 Encounter for general adult medical examination with abnormal findings: Secondary | ICD-10-CM | POA: Diagnosis not present

## 2024-06-17 DIAGNOSIS — M7062 Trochanteric bursitis, left hip: Secondary | ICD-10-CM | POA: Diagnosis not present

## 2024-06-17 DIAGNOSIS — I10 Essential (primary) hypertension: Secondary | ICD-10-CM | POA: Diagnosis not present

## 2024-06-22 DIAGNOSIS — M5451 Vertebrogenic low back pain: Secondary | ICD-10-CM | POA: Diagnosis not present

## 2024-08-11 ENCOUNTER — Encounter: Payer: Self-pay | Admitting: Cardiovascular Disease

## 2024-10-20 ENCOUNTER — Ambulatory Visit: Admitting: Student
